# Patient Record
Sex: Male | Born: 1945 | Race: White | Hispanic: No | Marital: Married | Smoking: Former smoker
Health system: Southern US, Community
[De-identification: ages and names within clinical notes are randomized; demographics above are authoritative.]

## PROBLEM LIST (undated history)

## (undated) DIAGNOSIS — E079 Disorder of thyroid, unspecified: Secondary | ICD-10-CM

## (undated) DIAGNOSIS — D649 Anemia, unspecified: Secondary | ICD-10-CM

## (undated) DIAGNOSIS — G309 Alzheimer's disease, unspecified: Secondary | ICD-10-CM

## (undated) DIAGNOSIS — E785 Hyperlipidemia, unspecified: Secondary | ICD-10-CM

## (undated) DIAGNOSIS — E559 Vitamin D deficiency, unspecified: Secondary | ICD-10-CM

## (undated) DIAGNOSIS — M86172 Other acute osteomyelitis, left ankle and foot: Secondary | ICD-10-CM

## (undated) DIAGNOSIS — F0280 Dementia in other diseases classified elsewhere without behavioral disturbance: Secondary | ICD-10-CM

## (undated) HISTORY — DX: Alzheimer's disease, unspecified: G30.9

## (undated) HISTORY — DX: Disorder of thyroid, unspecified: E07.9

## (undated) HISTORY — DX: Hyperlipidemia, unspecified: E78.5

## (undated) HISTORY — DX: Dementia in other diseases classified elsewhere, unspecified severity, without behavioral disturbance, psychotic disturbance, mood disturbance, and anxiety: F02.80

---

## 2003-05-22 ENCOUNTER — Ambulatory Visit (HOSPITAL_COMMUNITY): Admission: RE | Admit: 2003-05-22 | Discharge: 2003-05-22 | Payer: Self-pay | Admitting: *Deleted

## 2011-05-22 ENCOUNTER — Ambulatory Visit: Payer: Self-pay | Admitting: Family Medicine

## 2011-05-22 DIAGNOSIS — E039 Hypothyroidism, unspecified: Secondary | ICD-10-CM

## 2011-05-22 DIAGNOSIS — Z125 Encounter for screening for malignant neoplasm of prostate: Secondary | ICD-10-CM

## 2011-05-22 DIAGNOSIS — E785 Hyperlipidemia, unspecified: Secondary | ICD-10-CM

## 2011-11-15 ENCOUNTER — Other Ambulatory Visit: Payer: Self-pay | Admitting: Physician Assistant

## 2011-11-21 ENCOUNTER — Other Ambulatory Visit: Payer: Self-pay | Admitting: Physician Assistant

## 2012-01-18 ENCOUNTER — Other Ambulatory Visit: Payer: Self-pay | Admitting: Physician Assistant

## 2012-02-23 ENCOUNTER — Other Ambulatory Visit: Payer: Self-pay | Admitting: Physician Assistant

## 2012-02-25 ENCOUNTER — Other Ambulatory Visit: Payer: Self-pay | Admitting: Physician Assistant

## 2012-02-26 ENCOUNTER — Other Ambulatory Visit: Payer: Self-pay | Admitting: Physician Assistant

## 2012-02-26 NOTE — Telephone Encounter (Signed)
Chart pulled to [place] pool pile MR [id number]

## 2012-03-18 ENCOUNTER — Encounter: Payer: Self-pay | Admitting: Family Medicine

## 2012-03-18 ENCOUNTER — Ambulatory Visit (INDEPENDENT_AMBULATORY_CARE_PROVIDER_SITE_OTHER): Payer: Medicare Other | Admitting: Family Medicine

## 2012-03-18 VITALS — BP 102/64 | HR 60 | Temp 98.5°F | Resp 16 | Ht 73.0 in | Wt 235.2 lb

## 2012-03-18 DIAGNOSIS — R413 Other amnesia: Secondary | ICD-10-CM

## 2012-03-18 DIAGNOSIS — E785 Hyperlipidemia, unspecified: Secondary | ICD-10-CM

## 2012-03-18 DIAGNOSIS — E039 Hypothyroidism, unspecified: Secondary | ICD-10-CM

## 2012-03-18 LAB — CBC WITH DIFFERENTIAL/PLATELET
Basophils Absolute: 0 10*3/uL (ref 0.0–0.1)
Basophils Relative: 1 % (ref 0–1)
Eosinophils Absolute: 0.2 10*3/uL (ref 0.0–0.7)
Eosinophils Relative: 4 % (ref 0–5)
HCT: 41.1 % (ref 39.0–52.0)
Hemoglobin: 14 g/dL (ref 13.0–17.0)
Lymphocytes Relative: 34 % (ref 12–46)
Lymphs Abs: 1.5 10*3/uL (ref 0.7–4.0)
MCH: 29.2 pg (ref 26.0–34.0)
MCHC: 34.1 g/dL (ref 30.0–36.0)
MCV: 85.6 fL (ref 78.0–100.0)
Monocytes Absolute: 0.4 10*3/uL (ref 0.1–1.0)
Monocytes Relative: 9 % (ref 3–12)
Neutro Abs: 2.3 10*3/uL (ref 1.7–7.7)
Neutrophils Relative %: 52 % (ref 43–77)
Platelets: 220 10*3/uL (ref 150–400)
RBC: 4.8 MIL/uL (ref 4.22–5.81)
RDW: 13.8 % (ref 11.5–15.5)
WBC: 4.3 10*3/uL (ref 4.0–10.5)

## 2012-03-18 LAB — LIPID PANEL
Cholesterol: 182 mg/dL (ref 0–200)
HDL: 53 mg/dL (ref >39)
LDL Cholesterol: 104 mg/dL — ABNORMAL HIGH (ref 0–99)
Total CHOL/HDL Ratio: 3.4 Ratio
Triglycerides: 125 mg/dL (ref <150)
VLDL: 25 mg/dL (ref 0–40)

## 2012-03-18 LAB — COMPREHENSIVE METABOLIC PANEL
ALT: 16 U/L (ref 0–53)
AST: 17 U/L (ref 0–37)
Albumin: 4.3 g/dL (ref 3.5–5.2)
Alkaline Phosphatase: 59 U/L (ref 39–117)
BUN: 10 mg/dL (ref 6–23)
CO2: 26 mEq/L (ref 19–32)
Calcium: 9.7 mg/dL (ref 8.4–10.5)
Chloride: 106 mEq/L (ref 96–112)
Creat: 0.83 mg/dL (ref 0.50–1.35)
Glucose, Bld: 102 mg/dL — ABNORMAL HIGH (ref 70–99)
Potassium: 4.1 mEq/L (ref 3.5–5.3)
Sodium: 139 mEq/L (ref 135–145)
Total Bilirubin: 0.3 mg/dL (ref 0.3–1.2)
Total Protein: 6.9 g/dL (ref 6.0–8.3)

## 2012-03-18 LAB — TSH: TSH: 3.389 u[IU]/mL (ref 0.350–4.500)

## 2012-03-18 LAB — VITAMIN B12: Vitamin B-12: 364 pg/mL (ref 211–911)

## 2012-03-18 MED ORDER — PRAVASTATIN SODIUM 40 MG PO TABS: 80.0000 mg | ORAL_TABLET | Freq: Every day | ORAL | Status: DC

## 2012-03-18 MED ORDER — LEVOTHYROXINE SODIUM 50 MCG PO TABS: 50.0000 ug | ORAL_TABLET | Freq: Every day | ORAL | Status: DC

## 2012-03-18 NOTE — Progress Notes (Signed)
Bruce Fleming Comes in today for followup on hypothyroidism and hyperlipidemia. He says he feels fine. He has no myalgias or headache or fatigue.  Of concern explains his wife, is that he is having increasing problems with memory. He tends to confuse driving around town, although his never gotten lost. He forgets where he puts things at home. He has a strong family history of Alzheimer's to his mother and grandmother.  Refuses dT and flu shot  Patient now retired.  Objective:  NAD.  Appears healthy and happy.   Depression questionnaire completed:  Score is 0 Eyes:  Normal fundi and external inspection Neck:  No thyromegaly Chest:  Clear Heart:  Regular, no murmur Skin: clear Abdomen:  Soft, nontender, no HSM Ext:  No edema  Mini mental status exam score 17:  Mild cognitive impairment.  Assessment:  Stable physically, but of great concern is the memory issues.  He does not want a neurology referral.  We discussed the possibility of Aricept, which he and wife declined at this point.    Plan: 1. Memory loss  Vitamin B12, TSH, Comprehensive metabolic panel, Lipid panel, CBC with Differential  2. Hypothyroid  TSH, levothyroxine (SYNTHROID, LEVOTHROID) 50 MCG tablet  3. Hyperlipidemia  Lipid panel, pravastatin (PRAVACHOL) 40 MG tablet   I reinforced the importance of taking the thyroid medication on an empty stomach

## 2012-08-19 ENCOUNTER — Telehealth: Payer: Self-pay

## 2012-08-19 NOTE — Telephone Encounter (Signed)
I have spoken to wife, she is advised letter will need to come from Dr Milus Glazier, jury duty summons not due until April 9th, to you . For review.

## 2012-08-19 NOTE — Telephone Encounter (Signed)
Pt's wife brought in a [employer] for pt and requests that we follow the directions on the form in order for pt to be excused d/t his dementia. I have put this in [place] McClung's box since she is covering for Dr L in his absence.

## 2012-08-19 NOTE — Telephone Encounter (Signed)
LMOM to CB. 

## 2012-08-19 NOTE — Telephone Encounter (Signed)
Call patient and let them know they need to bring in the note they intend to send that states what his disability is and an explanation of why it prevents him from serving(per the jury statement they brought [place]) that will go along with the doctor's note if Dr. Elbert Ewings approves it.  I do not see enough in the chart to document what the letter is asking for in the chart.  Dr. Elbert Ewings has only seen the patient one time, and I am unable to provide enough info.  This will have to wait until Dr. Elbert Ewings returns and I will put it in his box if they bring the other note.

## 2012-08-30 ENCOUNTER — Encounter: Payer: Self-pay | Admitting: Family Medicine

## 2012-09-07 NOTE — Telephone Encounter (Signed)
Letter has been written. 

## 2013-03-13 ENCOUNTER — Other Ambulatory Visit: Payer: Self-pay | Admitting: Family Medicine

## 2013-04-07 ENCOUNTER — Encounter: Payer: Self-pay | Admitting: Family Medicine

## 2013-04-07 ENCOUNTER — Ambulatory Visit (INDEPENDENT_AMBULATORY_CARE_PROVIDER_SITE_OTHER): Payer: Medicare Other | Admitting: Family Medicine

## 2013-04-07 VITALS — BP 110/74 | HR 71 | Temp 97.7°F | Resp 16 | Ht 73.5 in | Wt 239.0 lb

## 2013-04-07 DIAGNOSIS — E039 Hypothyroidism, unspecified: Secondary | ICD-10-CM

## 2013-04-07 DIAGNOSIS — R413 Other amnesia: Secondary | ICD-10-CM

## 2013-04-07 DIAGNOSIS — E785 Hyperlipidemia, unspecified: Secondary | ICD-10-CM

## 2013-04-07 LAB — COMPREHENSIVE METABOLIC PANEL
ALT: 15 U/L (ref 0–53)
AST: 16 U/L (ref 0–37)
Albumin: 4.3 g/dL (ref 3.5–5.2)
Alkaline Phosphatase: 58 U/L (ref 39–117)
BUN: 11 mg/dL (ref 6–23)
CO2: 27 mEq/L (ref 19–32)
Calcium: 9.6 mg/dL (ref 8.4–10.5)
Chloride: 105 mEq/L (ref 96–112)
Creat: 0.89 mg/dL (ref 0.50–1.35)
Glucose, Bld: 115 mg/dL — ABNORMAL HIGH (ref 70–99)
Potassium: 4.1 mEq/L (ref 3.5–5.3)
Sodium: 139 mEq/L (ref 135–145)
Total Bilirubin: 0.4 mg/dL (ref 0.3–1.2)
Total Protein: 6.9 g/dL (ref 6.0–8.3)

## 2013-04-07 LAB — TSH: TSH: 2.834 u[IU]/mL (ref 0.350–4.500)

## 2013-04-07 LAB — LIPID PANEL
Cholesterol: 234 mg/dL — ABNORMAL HIGH (ref 0–200)
HDL: 57 mg/dL (ref >39)
LDL Cholesterol: 157 mg/dL — ABNORMAL HIGH (ref 0–99)
Total CHOL/HDL Ratio: 4.1 Ratio
Triglycerides: 100 mg/dL (ref <150)
VLDL: 20 mg/dL (ref 0–40)

## 2013-04-07 MED ORDER — DONEPEZIL HCL 5 MG PO TABS: 5.0000 mg | ORAL_TABLET | Freq: Every evening | ORAL | Status: DC | PRN

## 2013-04-07 MED ORDER — LEVOTHYROXINE SODIUM 50 MCG PO TABS: 50.0000 ug | ORAL_TABLET | Freq: Every day | ORAL | Status: DC

## 2013-04-07 MED ORDER — PRAVASTATIN SODIUM 40 MG PO TABS: 80.0000 mg | ORAL_TABLET | Freq: Every day | ORAL | Status: DC
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
## 2013-04-07 NOTE — Patient Instructions (Signed)
Start coated baby aspirin 81 mg daily   Immunization Schedule, Adult  Influenza vaccine.  Adults should be given 1 dose every year.  Tetanus, diphtheria, and pertussis (Td, Tdap) vaccine.  Adults who have not previously been given Tdap or who do not know their vaccine status should be given 1 dose of Tdap.  Adults should have a Td booster every 10 years.  Doses should be given if needed to catch up on missed doses in the past.  Pregnant women should be given 1 dose of Tdap vaccine during each pregnancy.  Varicella vaccine.  All adults without evidence of immunity to varicella should receive 2 doses or a second dose if they have received only 1 dose.  Pregnant women who do not have evidence of immunity should be given the first dose after their pregnancy.  Human papillomavirus (HPV) vaccine.  Women aged 15 through 26 years who have not been given the vaccine previously should be given the 3 dose series. The second dose should be given 1 to 2 months after the first dose. The third dose should be given at least 24 weeks after the first dose.  The vaccine is not recommended for use in pregnant women. However, pregnancy testing is not needed before being given a dose. If a woman is found to be pregnant after being given a dose, no treatment is needed. In that case, the remaining doses should be delayed until after the pregnancy.  Men aged 43 through 21 years who have not been given the vaccine previously should be given the 3 dose series. Men aged 52 through 21 years may be given the 3 dose series. The second dose should be given 1 to 2 months after the first dose. The third dose should be given at least 24 weeks after the first dose.  Zoster vaccine.  One dose is recommended for adults aged 61 years and older unless certain conditions are present.  Measles, mumps, and rubella (MMR) vaccine.  Adults born before 29 generally are considered immune to measles and mumps. Healthcare  workers born before 1957 who do not have evidence of immunity should consider vaccination.  Adults born in [birth year] or later should have 1 or more doses of MMR vaccine unless there is a contraindication for the vaccine or they have evidence of immunity to the diseases. A second dose should be given at least 28 days after the first dose. Adults receiving certain types of previous vaccines should consider or be given vaccine doses.  For women of childbearing age, rubella immunity should be determined. If there is no evidence of immunity, women who are not pregnant should be vaccinated. If there is no evidence of immunity, women who are pregnant should delay vaccination until after their pregnancy.  Pneumococcal polysaccharide (PPSV23) vaccine.  All adults aged 30 years and older should be given 1 dose.  Adults younger than age 72 years who have certain medical conditions, who smoke cigarettes, who reside in nursing homes or long-term care facilities, or who have an unknown vaccination history should usually be given 1 or 2 doses of the vaccine.  Pneumococcal 13-valent conjugate (PVC13) vaccine.  Adults aged 93 years or older with certain medical conditions and an unknown or incomplete pneumococcal vaccination history should usually be given 1 dose of the vaccine. This dose may be in addition to a PPSV23 vaccine dose.  Meningococcal vaccine.  First-year college students up to age 58 years who are living in residence halls should be given a  dose if they did not receive a dose on or after their 16th birthday.  A dose should be given to microbiologists working with certain meningitis bacteria, military recruits, and people who travel to or live in countries with a high rate of meningitis.  One or 2 doses should be given to adults who have certain high-risk conditions.  Hepatitis A vaccine.  Adults who wish to be protected from this disease, who have certain high-risk conditions, who work with  hepatitis A-infected animals, who work in hepatitis A research labs, or who travel to or work in countries with a high rate of hepatitis A should be given the 2 dose series of the vaccine.  Adults who were previously unvaccinated and who anticipate close contact with an international adoptee during the first 60 days after arrival in the [place] from a country with a high rate of hepatitis A should be given the vaccine. The first dose of the 2 dose series should be given 2 or more weeks before the arrival of the adoptee.  Hepatitis B vaccine.  Adults who wish to be protected from this disease, who have certain high-risk conditions, who may be exposed to blood or other infectious body fluids, who are household contacts or sex partners of hepatitis B positive people, who are clients or workers in certain care facilities, or who travel to or work in countries with a high rate of hepatitis B should be given the 3 dose series of the vaccine. If you travel outside the [place], additional vaccines may be needed. The Centers for Disease Control and Prevention (CDC) provides information about the vaccines, medicines, and other measures necessary to prevent illness and injury during international travel. Visit the CDC website at http://www.church.org/ or call (800) CDC-INFO [(442)355-0501]. You may also consult a travel clinic or your caregiver. Document Released: 08/23/2003 Document Revised: 08/25/2011 Document Reviewed: 07/18/2011 [facility] Patient Information 2014 [place], [place].

## 2013-05-19 ENCOUNTER — Ambulatory Visit: Payer: Medicare Other | Admitting: Family Medicine

## 2014-02-03 ENCOUNTER — Other Ambulatory Visit: Payer: Self-pay | Admitting: Geriatric Medicine

## 2014-02-03 DIAGNOSIS — F0280 Dementia in other diseases classified elsewhere without behavioral disturbance: Secondary | ICD-10-CM

## 2014-02-03 DIAGNOSIS — G309 Alzheimer's disease, unspecified: Principal | ICD-10-CM

## 2014-02-13 ENCOUNTER — Ambulatory Visit
Admission: RE | Admit: 2014-02-13 | Discharge: 2014-02-13 | Disposition: A | Discharge status: Home / Self Care | Payer: Medicare Other | Source: Ambulatory Visit | Attending: Geriatric Medicine | Admitting: Geriatric Medicine

## 2014-02-13 DIAGNOSIS — F0280 Dementia in other diseases classified elsewhere without behavioral disturbance: Secondary | ICD-10-CM

## 2014-02-13 DIAGNOSIS — G309 Alzheimer's disease, unspecified: Principal | ICD-10-CM

## 2014-02-13 MED ORDER — GADOBENATE DIMEGLUMINE 529 MG/ML IV SOLN
20.0000 mL | Freq: Once | INTRAVENOUS | Status: AC | PRN
  Administered 2014-02-13: 20 mL via INTRAVENOUS

## 2014-10-14 ENCOUNTER — Other Ambulatory Visit: Payer: Self-pay | Admitting: Family Medicine

## 2014-11-12 ENCOUNTER — Other Ambulatory Visit: Payer: Self-pay | Admitting: Family Medicine

## 2014-11-19 ENCOUNTER — Other Ambulatory Visit: Payer: Self-pay | Admitting: Family Medicine

## 2014-11-19 NOTE — Telephone Encounter (Signed)
Pt is requesting a refill of pravastatin. He hasn't been seen since 2014, however he has scheduled an appt at 104 on 6/27. He wants to know if we can do a "courtesy refill" to hold him until then.

## 2014-11-20 NOTE — Telephone Encounter (Signed)
Please review pt req.

## 2014-12-11 ENCOUNTER — Ambulatory Visit (INDEPENDENT_AMBULATORY_CARE_PROVIDER_SITE_OTHER): Payer: Medicare Other | Admitting: Physician Assistant

## 2014-12-11 ENCOUNTER — Encounter: Payer: Self-pay | Admitting: Physician Assistant

## 2014-12-11 VITALS — BP 123/77 | HR 63 | Temp 98.1°F | Resp 16 | Ht 73.5 in | Wt 234.0 lb

## 2014-12-11 DIAGNOSIS — G309 Alzheimer's disease, unspecified: Secondary | ICD-10-CM

## 2014-12-11 DIAGNOSIS — E039 Hypothyroidism, unspecified: Secondary | ICD-10-CM | POA: Diagnosis not present

## 2014-12-11 DIAGNOSIS — E785 Hyperlipidemia, unspecified: Secondary | ICD-10-CM

## 2014-12-11 DIAGNOSIS — F0280 Dementia in other diseases classified elsewhere without behavioral disturbance: Secondary | ICD-10-CM

## 2014-12-11 LAB — TSH: TSH: 2.412 u[IU]/mL (ref 0.350–4.500)

## 2014-12-11 LAB — COMPREHENSIVE METABOLIC PANEL
ALBUMIN: 4.2 g/dL (ref 3.5–5.2)
ALT: 15 U/L (ref 0–53)
AST: 19 U/L (ref 0–37)
Alkaline Phosphatase: 55 U/L (ref 39–117)
BUN: 13 mg/dL (ref 6–23)
CHLORIDE: 105 meq/L (ref 96–112)
CO2: 27 mEq/L (ref 19–32)
CREATININE: 0.77 mg/dL (ref 0.50–1.35)
Calcium: 9.6 mg/dL (ref 8.4–10.5)
Glucose, Bld: 77 mg/dL (ref 70–99)
Potassium: 4.2 mEq/L (ref 3.5–5.3)
Sodium: 141 mEq/L (ref 135–145)
Total Bilirubin: 0.5 mg/dL (ref 0.2–1.2)
Total Protein: 6.9 g/dL (ref 6.0–8.3)

## 2014-12-11 LAB — LIPID PANEL
CHOL/HDL RATIO: 3.4 ratio
CHOLESTEROL: 209 mg/dL — AB (ref 0–200)
HDL: 61 mg/dL (ref >=40)
LDL CALC: 111 mg/dL — AB (ref 0–99)
TRIGLYCERIDES: 186 mg/dL — AB (ref <150)
VLDL: 37 mg/dL (ref 0–40)

## 2014-12-11 MED ORDER — PRAVASTATIN SODIUM 40 MG PO TABS: 80.0000 mg | ORAL_TABLET | Freq: Every day | ORAL | Status: DC

## 2014-12-11 MED ORDER — LEVOTHYROXINE SODIUM 50 MCG PO TABS: 50.0000 ug | ORAL_TABLET | Freq: Every day | ORAL | Status: DC

## 2014-12-11 NOTE — Progress Notes (Signed)
   Subjective:    Patient ID: Bruce Fleming, male    DOB: 1946/03/30, 69 y.o.   MRN: [id number]  HPI Patient presents with wife his caretaker for refill of levothyroxine and pravastatin. Wife states that he is just about out of both medications. Has been compliant as she is the one who administers them. Patient states that he is not following in particular diet and does not exercise. Denies SOB, CP, edema, HA, dizziness, N/V, fatigue, skin/hair changes, or constipation.   Wife states that patient was dx with Alzheimer's dementia 1 1/2 years ago and states that he has not had f/u with Dr. Pete Fleming since that appointment as she does not think it will make a difference. States that patient is roaming at night and is becoming more aggressive towards her over the past few months, but they are maintaining just fine. Is no longer taking Donepezil as they did not notice a difference.  Review of Systems  Constitutional: Negative for fever, activity change and fatigue.  Eyes: Negative for visual disturbance.  Respiratory: Negative for shortness of breath.   Cardiovascular: Negative for chest pain, palpitations and leg swelling.  Gastrointestinal: Negative for nausea, vomiting and constipation.  Endocrine: Negative.   Neurological: Negative for dizziness and headaches.  Psychiatric/Behavioral: Positive for behavioral problems, confusion and sleep disturbance. Negative for dysphoric mood.      Objective:   Physical Exam  Constitutional: He is oriented to person, place, and time. He appears well-developed and well-nourished. No distress.  Blood pressure 123/77, pulse 63, temperature 98.1 F (36.7 C), temperature source Oral, resp. rate 16, height 6' 1.5" (1.867 m), weight 234 lb (106.142 kg), SpO2 97 %.   HENT:  Head: Normocephalic and atraumatic.  Right Ear: External ear normal.  Left Ear: External ear normal.  Eyes: Conjunctivae are normal. Pupils are equal, round, and reactive to light.  Right eye exhibits no discharge. Left eye exhibits no discharge. No scleral icterus.  Neck: Normal range of motion. Neck supple. No JVD present. Carotid bruit is not present.  Cardiovascular: Normal rate, regular rhythm and normal heart sounds.  Exam reveals no gallop and no friction rub.   No murmur heard. Pulmonary/Chest: Effort normal and breath sounds normal. No respiratory distress. He has no wheezes. He has no rales.  Abdominal: Soft. Bowel sounds are normal.  Lymphadenopathy:    He has no cervical adenopathy.  Neurological: He is alert and oriented to person, place, and time.  Skin: He is not diaphoretic.  Psychiatric: He has a normal mood and affect. His behavior is normal. Judgment and thought content normal.       Assessment & Plan:  1. Hypothyroidism, unspecified hypothyroidism type - levothyroxine (SYNTHROID, LEVOTHROID) 50 MCG tablet; Take 1 tablet (50 mcg total) by mouth daily before breakfast. Needs office visit/labs  Dispense: 30 tablet; Refill: 11 - TSH  2. Hyperlipidemia Lifestyle modifications discussed. - pravastatin (PRAVACHOL) 40 MG tablet; Take 2 tablets (80 mg total) by mouth daily.  Dispense: 60 tablet; Refill: 0 - Lipid panel - Comprehensive metabolic panel  3. Alzheimer's disease Encouraged to f/u with Dr. Pete Fleming his geriatrician for better maintenance and tips for caring for dz as it progresses.   Janan Ridge PA-C  [facility] 12/11/2014 4:34 PM

## 2014-12-12 ENCOUNTER — Encounter: Payer: Self-pay | Admitting: Physician Assistant

## 2014-12-16 ENCOUNTER — Other Ambulatory Visit: Payer: Self-pay | Admitting: Physician Assistant

## 2015-02-09 ENCOUNTER — Encounter (HOSPITAL_COMMUNITY): Payer: Self-pay | Admitting: Emergency Medicine

## 2015-02-09 ENCOUNTER — Emergency Department (HOSPITAL_COMMUNITY)
Admission: EM | Admit: 2015-02-09 | Discharge: 2015-02-09 | Disposition: A | Discharge status: Home / Self Care | Payer: Medicare Other | Attending: Emergency Medicine | Admitting: Emergency Medicine

## 2015-02-09 DIAGNOSIS — Z87891 Personal history of nicotine dependence: Secondary | ICD-10-CM | POA: Insufficient documentation

## 2015-02-09 DIAGNOSIS — E785 Hyperlipidemia, unspecified: Secondary | ICD-10-CM | POA: Insufficient documentation

## 2015-02-09 DIAGNOSIS — R000 Tachycardia, unspecified: Secondary | ICD-10-CM | POA: Insufficient documentation

## 2015-02-09 DIAGNOSIS — F0280 Dementia in other diseases classified elsewhere without behavioral disturbance: Secondary | ICD-10-CM | POA: Diagnosis not present

## 2015-02-09 DIAGNOSIS — G309 Alzheimer's disease, unspecified: Secondary | ICD-10-CM | POA: Diagnosis not present

## 2015-02-09 DIAGNOSIS — Z79899 Other long term (current) drug therapy: Secondary | ICD-10-CM | POA: Insufficient documentation

## 2015-02-09 DIAGNOSIS — E079 Disorder of thyroid, unspecified: Secondary | ICD-10-CM | POA: Diagnosis not present

## 2015-02-09 DIAGNOSIS — F0390 Unspecified dementia without behavioral disturbance: Secondary | ICD-10-CM

## 2015-02-09 LAB — URINALYSIS, ROUTINE W REFLEX MICROSCOPIC
Bilirubin Urine: NEGATIVE
GLUCOSE, UA: NEGATIVE mg/dL
Hgb urine dipstick: NEGATIVE
KETONES UR: NEGATIVE mg/dL
Leukocytes, UA: NEGATIVE
Nitrite: NEGATIVE
Protein, ur: NEGATIVE mg/dL
Specific Gravity, Urine: 1.027 (ref 1.005–1.030)
UROBILINOGEN UA: 0.2 mg/dL (ref 0.0–1.0)
pH: 6 (ref 5.0–8.0)

## 2015-02-09 LAB — CBC
HCT: 39.4 % (ref 39.0–52.0)
HEMOGLOBIN: 12.8 g/dL — AB (ref 13.0–17.0)
MCH: 28.4 pg (ref 26.0–34.0)
MCHC: 32.5 g/dL (ref 30.0–36.0)
MCV: 87.6 fL (ref 78.0–100.0)
PLATELETS: 188 10*3/uL (ref 150–400)
RBC: 4.5 MIL/uL (ref 4.22–5.81)
RDW: 13 % (ref 11.5–15.5)
WBC: 4.6 10*3/uL (ref 4.0–10.5)

## 2015-02-09 LAB — COMPREHENSIVE METABOLIC PANEL
ALK PHOS: 54 U/L (ref 38–126)
ALT: 18 U/L (ref 17–63)
ANION GAP: 5 (ref 5–15)
AST: 26 U/L (ref 15–41)
Albumin: 3.9 g/dL (ref 3.5–5.0)
BILIRUBIN TOTAL: 0.6 mg/dL (ref 0.3–1.2)
BUN: 20 mg/dL (ref 6–20)
CALCIUM: 9.1 mg/dL (ref 8.9–10.3)
CO2: 27 mmol/L (ref 22–32)
CREATININE: 0.86 mg/dL (ref 0.61–1.24)
Chloride: 106 mmol/L (ref 101–111)
Glucose, Bld: 98 mg/dL (ref 65–99)
Potassium: 4.1 mmol/L (ref 3.5–5.1)
Sodium: 138 mmol/L (ref 135–145)
TOTAL PROTEIN: 6.9 g/dL (ref 6.5–8.1)

## 2015-02-09 MED ORDER — TUBERCULIN PPD 5 UNIT/0.1ML ID SOLN
5.0000 [IU] | Freq: Once | INTRADERMAL | Status: DC
  Administered 2015-02-09: 5 [IU] via INTRADERMAL
  Filled 2015-02-09 (×2): qty 0.1
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
## 2015-02-09 NOTE — ED Notes (Signed)
Bed: WA08 Expected date:  Expected time:  Means of arrival:  Comments: HOLD for triage 3
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
## 2015-02-09 NOTE — Progress Notes (Signed)
CSW received call from [facility]. Patient recently had increased confusion and altered mental status, and even had issued a silver alert for patient. Patient family concerned about drastic confusion. Pt family reports possibly early stages of dementia. Per [facility], patient can be assessed by ALF nurse in the ED, and possibly admitted to ALF today. CSW to complete fl2 once MD completes medical evaluation. Pt will need tb skin test placed with instructions to be read at facility.   Olga Coaster, LCSW  Clinical Social Work  [employer] [phone]

## 2015-02-09 NOTE — ED Notes (Signed)
Delay on lab draw until further notice from triage nurse

## 2015-02-09 NOTE — ED Notes (Signed)
Pt left the house on Wed. And was missing (Silver alert out for pt) until this morning.  Pt has dementia and is currently needing testing to get into [facility].  Belenda Cruise with social work has been made aware and is currently working with [employer] to get the steps done to get pt there.

## 2015-02-23 ENCOUNTER — Telehealth: Payer: Self-pay

## 2015-02-23 NOTE — Telephone Encounter (Signed)
Dr. Milus Glazier, patient's wife Bonita Quin states patient is now at [facility] in the memory care unit. She would like for you to fax over a letter stating he no longer needs his thyroid and cholesterol medications (levothyroxine and pravastatin). She says "it's crazy to take meds if he's slowly dying." Per patient's wife, he was on a silver alert 2 weeks ago. She says her and her husband have talked about this already and they don't want to do anything to prolong living like this. She says the cost of [facility] is $6000 per month. Can a letter be faxed? If so, send to attn Algis Liming at fax number [phone] (fax number provided by Bonita Quin). Call if any questions at [phone] (home #).

## 2015-02-23 NOTE — Telephone Encounter (Signed)
Dr L, please read other issue below. [facility] is requesting a Rx for Ativan 0.5 mg, 1 tab BID prn anxiety/agitation. This should be faxed to pharm at [phone] or facility [phone].

## 2015-02-24 ENCOUNTER — Other Ambulatory Visit: Payer: Self-pay | Admitting: Family Medicine

## 2015-02-24 MED ORDER — LORAZEPAM 0.5 MG PO TABS: 0.5000 mg | ORAL_TABLET | Freq: Two times a day (BID) | ORAL | Status: DC | PRN

## 2015-02-26 NOTE — Telephone Encounter (Signed)
Dr. Milus Glazier, if you can review the last message from [facility] regarding a letter that the wife is requesting for pt Bruce Fleming. If you have already written a letter please send message to our clinical pool.

## 2015-02-27 ENCOUNTER — Encounter (HOSPITAL_COMMUNITY): Payer: Self-pay | Admitting: Family Medicine

## 2015-02-27 NOTE — Telephone Encounter (Signed)
Faxed Rx to pharm at number below. Dr L, please address the letter below.

## 2015-03-06 NOTE — Telephone Encounter (Signed)
Faxed letter written by Dr L as pt's wife req'd to Algis Liming at [facility] w/confirmation.

## 2015-03-14 ENCOUNTER — Telehealth: Payer: Self-pay

## 2015-03-14 NOTE — Telephone Encounter (Signed)
Bonita Quin is calling because she saw that Dr. Patria Mane documented that the patient has 3 months left to live. She wants to know if this is correct or not. Patient normally sees Dr. Milus Glazier. Please call! [phone]

## 2015-03-15 ENCOUNTER — Telehealth: Payer: Self-pay | Admitting: Family Medicine

## 2015-03-15 NOTE — Telephone Encounter (Signed)
Please inform Bonita Quin that in order to have Hospice come, we have to indicate a three month survival expectation.  Otherwise, they won't come

## 2015-03-15 NOTE — Telephone Encounter (Signed)
[facility] states that they faxed two forms over in reference to patient's lab results. They faxed this on 03/12/2015 and were told that it would be faxed back the same day. Can they have a status update?  [phone] (phone)  [phone] (fax)

## 2015-03-15 NOTE — Telephone Encounter (Signed)
Spoke with Bonita Quin about Ravinder, I gave her your message, she stated she understood but wanted to verify with you if you saw any indication that he wouldn't make it to three months or more, from previous lab work and past history. Also she wanted to know if you had spoken to Dr. Patria Mane about Iantha Fallen health recently.

## 2015-03-15 NOTE — Telephone Encounter (Signed)
Patient's wife Bruce Fleming left voicemail requesting medical records. Did not say specifically which records. Please call back at [phone].

## 2015-03-15 NOTE — Telephone Encounter (Signed)
Let message for Bonita Quin to call back.

## 2015-03-16 ENCOUNTER — Telehealth: Payer: Self-pay

## 2015-03-16 NOTE — Telephone Encounter (Signed)
[facility] called today because Vivan wife called them to set up hospice and from what she had told them they did not feel it was necessary for him to have hospice. They wanted to get a medical opinion from you if you feel like he needs it. So they gave a number and fax number. The person who called was Drenda Freeze. The office number is [phone]. Fax # [phone]

## 2015-03-16 NOTE — Telephone Encounter (Signed)
Linda left voicemail on Thursday at 3:19pm asking for a call back regarding records. Please call.

## 2015-03-16 NOTE — Telephone Encounter (Signed)
Have you seen these forms? 

## 2015-03-16 NOTE — Telephone Encounter (Signed)
I've looked in all the scan boxes and Dr. Loma Boston box and did not see any forms.

## 2015-03-16 NOTE — Telephone Encounter (Signed)
I have not seen these forms but I will look for them.

## 2015-03-18 ENCOUNTER — Telehealth: Payer: Self-pay
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
## 2015-03-19 NOTE — Telephone Encounter (Signed)
Spoke with nurse she just needs his orders signed. His fingers are not swollen today but they really need these orders. Dr Milus Glazier do you recall?

## 2015-03-19 NOTE — Telephone Encounter (Signed)
Pt should RTC.

## 2015-03-19 NOTE — Telephone Encounter (Signed)
Please call patient and ask what is going on. KL

## 2015-03-20 NOTE — Telephone Encounter (Signed)
FL-2 form received and placed in Dr. Loma Boston box for review and to sign. Cover page has instructions to fax to Tammy at [phone].

## 2015-03-20 NOTE — Telephone Encounter (Signed)
Spoke with Bruce Fleming. She states [place] at [facility] has faxed an FL-2 form for Dr. Milus Glazier to complete. Once the form is completed, she needs it faxed to attn Tammy at [facility]. Bruce Fleming states [facility] have taken her for $10,000 within a month and is moving her husband to the new facility. Will look for forms. If unable to locate form, will call [facility] and have them refax it.

## 2015-03-21 ENCOUNTER — Telehealth: Payer: Self-pay

## 2015-03-21 NOTE — Telephone Encounter (Signed)
DR L - Pt's wife came by and she is having him moved to a different location.  She needs forms filled out by you and faxed over to [facility].  Medical records are getting together the records needed.  Linda's number is [phone] if you need to call her.   I have left the folder she brought by in your box.

## 2015-03-27 NOTE — Telephone Encounter (Signed)
Patient's wife Bonita Quin signed a request on 03/21/15 to have records sent to [facility]. She was not sure what they needed and asked [place] to call. Called today and was transferred to the Memory Care Unit (where the patient will be going) and spoke with Joni Reining. I asked what was needed. She proceeded to give a list of what they needed. When I stated that the FL-2 form was faxed last week with confirmation to attn Tammy (who I was told to fax it to) she got really rude and stated "I don't care who you were told to fax it to, I don't have it and you need to fax it again." I told her she didn't have to be rude and she got louder and said "I'm not being rude, I'm telling you what I need you to send." I told her I would send last 5 office notes and she can go from there based of those ov notes. I asked for fax number and refused to give it to me until I gave her my name (which I did when she first got on the phone). I gave her my first name only then she gave me the fax# [phone]. Records faxed to her attention.

## 2015-03-29 ENCOUNTER — Telehealth: Payer: Self-pay | Admitting: Radiology

## 2015-03-29 NOTE — Telephone Encounter (Signed)
There is a FL2 to be filled out but Dr L can not complete this without evaluating the patient, he has not seen patient in over 2 years. I left message for her to advise, she may call back

## 2015-04-12 ENCOUNTER — Encounter: Payer: Self-pay | Admitting: Family Medicine

## 2015-04-12 ENCOUNTER — Telehealth: Payer: Self-pay | Admitting: Family Medicine

## 2015-04-12 NOTE — Telephone Encounter (Signed)
LEFT A MESSAGE FOR PATIENT TO RETURN CALL TO SCHEDULE APPOINTMENT TO CLOSE GAPS IN CARE.  HE NEEDS COLONOSCOPY, PREVNAR VACCINE, BMI SCREENING AND FOLLOW UP PLAN, AND SCREENING FOR HIGH BLOOD PRESSURE AND FOLLOW UP DOCUMENTATION.

## 2015-04-16 NOTE — Telephone Encounter (Signed)
Wife states that he is settled into an assisted living place and no longer needs form filled out.

## 2015-04-16 NOTE — Telephone Encounter (Signed)
Wife states that he is settled into an assisting living place and that pt will not be in to close these gaps in care.  Just FYI.

## 2015-04-23 ENCOUNTER — Encounter: Payer: Self-pay | Admitting: Family Medicine

## 2016-01-14 ENCOUNTER — Encounter (HOSPITAL_COMMUNITY): Payer: Self-pay | Admitting: Emergency Medicine

## 2016-01-14 ENCOUNTER — Emergency Department (HOSPITAL_COMMUNITY)
Admission: EM | Admit: 2016-01-14 | Discharge: 2016-01-14 | Disposition: A | Discharge status: Home / Self Care | Payer: Medicare Other | Attending: Emergency Medicine | Admitting: Emergency Medicine

## 2016-01-14 DIAGNOSIS — Z87891 Personal history of nicotine dependence: Secondary | ICD-10-CM | POA: Diagnosis not present

## 2016-01-14 DIAGNOSIS — F918 Other conduct disorders: Secondary | ICD-10-CM | POA: Diagnosis present

## 2016-01-14 DIAGNOSIS — Z0389 Encounter for observation for other suspected diseases and conditions ruled out: Secondary | ICD-10-CM | POA: Insufficient documentation

## 2016-01-14 DIAGNOSIS — R4689 Other symptoms and signs involving appearance and behavior: Secondary | ICD-10-CM

## 2016-01-14 DIAGNOSIS — G309 Alzheimer's disease, unspecified: Secondary | ICD-10-CM | POA: Insufficient documentation

## 2016-01-14 DIAGNOSIS — Z79899 Other long term (current) drug therapy: Secondary | ICD-10-CM | POA: Insufficient documentation

## 2016-01-14 LAB — URINALYSIS, ROUTINE W REFLEX MICROSCOPIC
Bilirubin Urine: NEGATIVE
GLUCOSE, UA: NEGATIVE mg/dL
Hgb urine dipstick: NEGATIVE
Ketones, ur: NEGATIVE mg/dL
LEUKOCYTES UA: NEGATIVE
NITRITE: NEGATIVE
PH: 6.5 (ref 5.0–8.0)
Protein, ur: NEGATIVE mg/dL
SPECIFIC GRAVITY, URINE: 1.024 (ref 1.005–1.030)

## 2016-01-14 LAB — CBC WITH DIFFERENTIAL/PLATELET
BASOS PCT: 0 %
Basophils Absolute: 0 10*3/uL (ref 0.0–0.1)
Eosinophils Absolute: 0.1 10*3/uL (ref 0.0–0.7)
Eosinophils Relative: 1 %
HEMATOCRIT: 34.9 % — AB (ref 39.0–52.0)
HEMOGLOBIN: 11.9 g/dL — AB (ref 13.0–17.0)
LYMPHS ABS: 1.2 10*3/uL (ref 0.7–4.0)
LYMPHS PCT: 22 %
MCH: 29.8 pg (ref 26.0–34.0)
MCHC: 34.1 g/dL (ref 30.0–36.0)
MCV: 87.5 fL (ref 78.0–100.0)
MONOS PCT: 14 %
Monocytes Absolute: 0.7 10*3/uL (ref 0.1–1.0)
NEUTROS ABS: 3.3 10*3/uL (ref 1.7–7.7)
NEUTROS PCT: 63 %
Platelets: 157 10*3/uL (ref 150–400)
RBC: 3.99 MIL/uL — ABNORMAL LOW (ref 4.22–5.81)
RDW: 12.9 % (ref 11.5–15.5)
WBC: 5.2 10*3/uL (ref 4.0–10.5)

## 2016-01-14 LAB — BASIC METABOLIC PANEL
Anion gap: 4 — ABNORMAL LOW (ref 5–15)
BUN: 21 mg/dL — ABNORMAL HIGH (ref 6–20)
CHLORIDE: 107 mmol/L (ref 101–111)
CO2: 28 mmol/L (ref 22–32)
CREATININE: 0.61 mg/dL (ref 0.61–1.24)
Calcium: 8.9 mg/dL (ref 8.9–10.3)
GFR calc non Af Amer: >60 mL/min (ref >60)
GLUCOSE: 92 mg/dL (ref 65–99)
Potassium: 3.8 mmol/L (ref 3.5–5.1)
Sodium: 139 mmol/L (ref 135–145)

## 2016-01-14 LAB — VALPROIC ACID LEVEL: Valproic Acid Lvl: 13 ug/mL — ABNORMAL LOW (ref 50.0–100.0)
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
## 2016-01-14 NOTE — Discharge Instructions (Signed)
Depakote level was a little low today.  This will need to be addressed with your primary care doctor so please follow-up with them. Return here for any new/worsening symptoms or concerns.
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
## 2016-02-01 ENCOUNTER — Emergency Department (HOSPITAL_COMMUNITY)
Admission: EM | Admit: 2016-02-01 | Discharge: 2016-02-01 | Disposition: A | Discharge status: Home / Self Care | Payer: Medicare Other | Attending: Emergency Medicine | Admitting: Emergency Medicine

## 2016-02-01 DIAGNOSIS — F0391 Unspecified dementia with behavioral disturbance: Secondary | ICD-10-CM | POA: Insufficient documentation

## 2016-02-01 DIAGNOSIS — R451 Restlessness and agitation: Secondary | ICD-10-CM

## 2016-02-01 DIAGNOSIS — Z79899 Other long term (current) drug therapy: Secondary | ICD-10-CM | POA: Diagnosis not present

## 2016-02-01 DIAGNOSIS — Z87891 Personal history of nicotine dependence: Secondary | ICD-10-CM | POA: Diagnosis not present

## 2016-02-01 DIAGNOSIS — E039 Hypothyroidism, unspecified: Secondary | ICD-10-CM | POA: Insufficient documentation

## 2016-02-01 LAB — CBC
HEMATOCRIT: 34.5 % — AB (ref 39.0–52.0)
Hemoglobin: 11.7 g/dL — ABNORMAL LOW (ref 13.0–17.0)
MCH: 29.6 pg (ref 26.0–34.0)
MCHC: 33.9 g/dL (ref 30.0–36.0)
MCV: 87.3 fL (ref 78.0–100.0)
PLATELETS: 177 10*3/uL (ref 150–400)
RBC: 3.95 MIL/uL — ABNORMAL LOW (ref 4.22–5.81)
RDW: 13.1 % (ref 11.5–15.5)
WBC: 5.6 10*3/uL (ref 4.0–10.5)

## 2016-02-01 LAB — URINALYSIS, ROUTINE W REFLEX MICROSCOPIC
BILIRUBIN URINE: NEGATIVE
GLUCOSE, UA: NEGATIVE mg/dL
HGB URINE DIPSTICK: NEGATIVE
KETONES UR: NEGATIVE mg/dL
Leukocytes, UA: NEGATIVE
Nitrite: NEGATIVE
PH: 7 (ref 5.0–8.0)
PROTEIN: NEGATIVE mg/dL
Specific Gravity, Urine: 1.008 (ref 1.005–1.030)

## 2016-02-01 LAB — BASIC METABOLIC PANEL
Anion gap: 6 (ref 5–15)
BUN: 14 mg/dL (ref 6–20)
CALCIUM: 8.9 mg/dL (ref 8.9–10.3)
CO2: 26 mmol/L (ref 22–32)
CREATININE: 0.83 mg/dL (ref 0.61–1.24)
Chloride: 105 mmol/L (ref 101–111)
GFR calc Af Amer: >60 mL/min (ref >60)
GLUCOSE: 96 mg/dL (ref 65–99)
Potassium: 3.8 mmol/L (ref 3.5–5.1)
Sodium: 137 mmol/L (ref 135–145)

## 2016-02-01 LAB — VALPROIC ACID LEVEL: Valproic Acid Lvl: 13 ug/mL — ABNORMAL LOW (ref 50.0–100.0)
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
## 2016-02-01 NOTE — Discharge Instructions (Signed)
1. Medications: usual home medications - but please discuss them with your provider as adjustments may be necessary 2. Treatment: rest, drink plenty of fluids,  3. Follow Up: Please followup with your primary doctor in 1-2 days for discussion of your diagnoses and further evaluation after today's visit; if you do not have a primary care doctor use the resource guide provided to find one; Please return to the ER for worsening symptoms, altered mental status or other concerns

## 2016-02-01 NOTE — ED Notes (Signed)
Bed: WA06 Expected date:  Expected time:  Means of arrival:  Comments: 

## 2016-02-21 ENCOUNTER — Encounter (HOSPITAL_COMMUNITY): Payer: Self-pay | Admitting: Emergency Medicine

## 2016-02-21 ENCOUNTER — Emergency Department (HOSPITAL_COMMUNITY)
Admission: EM | Admit: 2016-02-21 | Discharge: 2016-02-21 | Discharge status: Against Medical Advice | Payer: Medicare Other | Attending: Emergency Medicine | Admitting: Emergency Medicine

## 2016-02-21 DIAGNOSIS — E039 Hypothyroidism, unspecified: Secondary | ICD-10-CM | POA: Diagnosis not present

## 2016-02-21 DIAGNOSIS — F0391 Unspecified dementia with behavioral disturbance: Secondary | ICD-10-CM | POA: Diagnosis not present

## 2016-02-21 DIAGNOSIS — Z79899 Other long term (current) drug therapy: Secondary | ICD-10-CM | POA: Insufficient documentation

## 2016-02-21 DIAGNOSIS — Z87891 Personal history of nicotine dependence: Secondary | ICD-10-CM | POA: Insufficient documentation

## 2016-02-21 DIAGNOSIS — F918 Other conduct disorders: Secondary | ICD-10-CM | POA: Diagnosis present

## 2016-02-21 LAB — COMPREHENSIVE METABOLIC PANEL
ALT: 14 U/L — ABNORMAL LOW (ref 17–63)
ANION GAP: 6 (ref 5–15)
AST: 15 U/L (ref 15–41)
Albumin: 3.8 g/dL (ref 3.5–5.0)
Alkaline Phosphatase: 52 U/L (ref 38–126)
BILIRUBIN TOTAL: 0.5 mg/dL (ref 0.3–1.2)
BUN: 13 mg/dL (ref 6–20)
CALCIUM: 9.2 mg/dL (ref 8.9–10.3)
CO2: 28 mmol/L (ref 22–32)
Chloride: 105 mmol/L (ref 101–111)
Creatinine, Ser: 0.7 mg/dL (ref 0.61–1.24)
GFR calc Af Amer: >60 mL/min (ref >60)
Glucose, Bld: 95 mg/dL (ref 65–99)
POTASSIUM: 3.7 mmol/L (ref 3.5–5.1)
Sodium: 139 mmol/L (ref 135–145)
TOTAL PROTEIN: 6.5 g/dL (ref 6.5–8.1)

## 2016-02-21 LAB — CBC WITH DIFFERENTIAL/PLATELET
Basophils Absolute: 0 10*3/uL (ref 0.0–0.1)
Basophils Relative: 0 %
Eosinophils Absolute: 0.1 10*3/uL (ref 0.0–0.7)
Eosinophils Relative: 2 %
HEMATOCRIT: 36.3 % — AB (ref 39.0–52.0)
Hemoglobin: 12.3 g/dL — ABNORMAL LOW (ref 13.0–17.0)
LYMPHS PCT: 25 %
Lymphs Abs: 1.4 10*3/uL (ref 0.7–4.0)
MCH: 29.6 pg (ref 26.0–34.0)
MCHC: 33.9 g/dL (ref 30.0–36.0)
MCV: 87.5 fL (ref 78.0–100.0)
MONO ABS: 0.6 10*3/uL (ref 0.1–1.0)
MONOS PCT: 11 %
NEUTROS ABS: 3.4 10*3/uL (ref 1.7–7.7)
Neutrophils Relative %: 62 %
Platelets: 169 10*3/uL (ref 150–400)
RBC: 4.15 MIL/uL — ABNORMAL LOW (ref 4.22–5.81)
RDW: 12.9 % (ref 11.5–15.5)
WBC: 5.5 10*3/uL (ref 4.0–10.5)

## 2016-02-21 LAB — ETHANOL

## 2016-02-21 NOTE — ED Provider Notes (Signed)
[facility]-EMER[facility]Y DEPT Provider Note   CSN: [id number] Arrival date & time: 02/21/16  0053  By signing my name below, I, Majel Homer, attest that this documentation has been prepared under the direction and in the presence of Brylie Sneath, MD . Electronically Signed: Majel Homer, Scribe. 02/21/2016. 3:05 AM.  History   Chief Complaint Chief Complaint  Patient presents with  . Aggressive Behavior   The history is provided by the patient, the spouse and a relative. No language interpreter was used.   HPI Comments: Bruce Fleming is a 70 y.o. male with PMHx of Alzheimer's disease, HLD and thyroid disease, who presents to the Emergency Department from [facility] for an evaluation of aggressive and combative behavior that began a few weeks ago and worsened today. Pt's daughter reports she was contacted from his facility about pt acting aggressively and threatening other residents and staff; pt is calm and responsive now in the ED. Per wife, pt recently began taking estrogen due to pt "jacking off" in front of staff and residents and they find it offensive. She notes pt is also taking  lorazepam, Depakote, paroxetine and aspartame. Pt denies dysuria.   Past Medical History:  Diagnosis Date  . Alzheimer's dementia   . Hyperlipidemia   . Thyroid disease     Patient Active Problem List   Diagnosis Date Noted  . Hypothyroid 04/07/2013  . Hyperlipidemia 04/07/2013  . Memory loss, short term 04/07/2013   History reviewed. No pertinent surgical history.  Home Medications    Prior to Admission medications   Medication Sig Start Date End Date Taking? Authorizing Provider  divalproex (DEPAKOTE) 125 MG DR tablet Take 250 mg by mouth 2 (two) times daily.    Yes Historical Provider, MD  estradiol (ESTRACE) 0.5 MG tablet Take 0.5 mg by mouth daily.   Yes Historical Provider, MD  LORazepam (ATIVAN) 0.5 MG tablet Take 1 tablet (0.5 mg total) by mouth 2 (two) times daily as needed for  anxiety. This should be faxed to pharm at [phone] or facility [phone]. Patient taking differently: Take 0.5 mg by mouth 3 (three) times daily.  02/24/15  Yes Elvina Sidle, MD  PARoxetine (PAXIL) 20 MG tablet Take 20 mg by mouth daily.   Yes Historical Provider, MD    Family History Family History  Problem Relation Age of Onset  . Alzheimer's disease Mother   . Alzheimer's disease Maternal Grandmother     Social History Social History  Substance Use Topics  . Smoking status: Former Smoker    Years: 10.00    Types: Cigarettes    Quit date: 06/16/1978  . Smokeless tobacco: Never Used  . Alcohol use Yes     Comment: SPECIAL OCCASSION     Allergies   Review of patient's allergies indicates no known allergies.  Review of Systems Review of Systems  Genitourinary: Negative for dysuria.  Psychiatric/Behavioral: Positive for behavioral problems.  All other systems reviewed and are negative.   Physical Exam Updated Vital Signs BP 102/62   Pulse 62   Temp 97.8 F (36.6 C) (Oral)   Resp 20   SpO2 96%   Physical Exam  Constitutional: He appears well-developed and well-nourished.  Well appearing and congenial    HENT:  Head: Normocephalic.  Right Ear: External ear normal.  Left Ear: External ear normal.  Mouth/Throat: Oropharynx is clear and moist. No oropharyngeal exudate.  Moist mucous membranes, no exudate No sign of bruising, no battle sign or racoon eyes  Eyes:  Conjunctivae and EOM are normal. Pupils are equal, round, and reactive to light. Right eye exhibits no discharge. Left eye exhibits no discharge. No scleral icterus.  Neck: Normal range of motion. Neck supple. No JVD present. No tracheal deviation present.  Trachea is midline. No stridor or carotid bruits.  Cardiovascular: Normal rate, regular rhythm, normal heart sounds and intact distal pulses.   No murmur heard. Pulmonary/Chest: Effort normal and breath sounds normal. No stridor. No respiratory  distress. He has no wheezes. He has no rales.  Lungs CTA bilaterally.  Abdominal: Soft. Bowel sounds are normal. He exhibits no distension. There is no tenderness. There is no rebound and no guarding.  Musculoskeletal: Normal range of motion. He exhibits no edema or tenderness.  All compartments soft  Lymphadenopathy:    He has no cervical adenopathy.  Neurological: He is alert. He has normal reflexes. He displays normal reflexes. He exhibits normal muscle tone.  Skin: Skin is warm and dry. Capillary refill takes less than 2 seconds.  Psychiatric: He has a normal mood and affect. His behavior is normal. His mood appears not anxious. His affect is not angry and not inappropriate. His speech is not rapid and/or pressured. He is not agitated and not aggressive.  Nursing note and vitals reviewed.  Vitals:   02/21/16 0102 02/21/16 0313  BP: 102/62 98/73  Pulse: 62 68  Resp: 20 20  Temp: 97.8 F (36.6 C)     ED Treatments / Results  Labs Results for orders placed or performed during the hospital encounter of 02/21/16  CBC with Differential/Platelet  Result Value Ref Range   WBC 5.5 4.0 - 10.5 K/uL   RBC 4.15 (L) 4.22 - 5.81 MIL/uL   Hemoglobin 12.3 (L) 13.0 - 17.0 g/dL   HCT 54.036.3 (L) 98.139.0 - 19.152.0 %   MCV 87.5 78.0 - 100.0 fL   MCH 29.6 26.0 - 34.0 pg   MCHC 33.9 30.0 - 36.0 g/dL   RDW 47.812.9 29.511.5 - 62.115.5 %   Platelets 169 150 - 400 K/uL   Neutrophils Relative % 62 %   Neutro Abs 3.4 1.7 - 7.7 K/uL   Lymphocytes Relative 25 %   Lymphs Abs 1.4 0.7 - 4.0 K/uL   Monocytes Relative 11 %   Monocytes Absolute 0.6 0.1 - 1.0 K/uL   Eosinophils Relative 2 %   Eosinophils Absolute 0.1 0.0 - 0.7 K/uL   Basophils Relative 0 %   Basophils Absolute 0.0 0.0 - 0.1 K/uL  Comprehensive metabolic panel  Result Value Ref Range   Sodium 139 135 - 145 mmol/L   Potassium 3.7 3.5 - 5.1 mmol/L   Chloride 105 101 - 111 mmol/L   CO2 28 22 - 32 mmol/L   Glucose, Bld 95 65 - 99 mg/dL   BUN 13 6 - 20  mg/dL   Creatinine, Ser 3.080.70 0.61 - 1.24 mg/dL   Calcium 9.2 8.9 - 65.710.3 mg/dL   Total Protein 6.5 6.5 - 8.1 g/dL   Albumin 3.8 3.5 - 5.0 g/dL   AST 15 15 - 41 U/L   ALT 14 (L) 17 - 63 U/L   Alkaline Phosphatase 52 38 - 126 U/L   Total Bilirubin 0.5 0.3 - 1.2 mg/dL   GFR calc non Af Amer >60 >60 mL/min   GFR calc Af Amer >60 >60 mL/min   Anion gap 6 5 - 15  Ethanol  Result Value Ref Range   Alcohol, Ethyl (B) <5 <5 mg/dL   No  results found.   Radiology No results found.  Procedures Procedures  DIAGNOSTIC STUDIES:  Oxygen Saturation is 96% on RA, normal by my interpretation.    COORDINATION OF CARE:  3:03 AM Discussed treatment plan with pts family at bedside and pts family agreed to plan.  Medications Ordered in ED Medications - No data to display   Initial Impression / Assessment and Plan / ED Course  I have reviewed the triage vital signs and the nursing notes.  Pertinent labs & imaging results that were available during my care of the patient were reviewed by me and considered in my medical decision making (see chart for details).   I personally performed the services described in this documentation, which was scribed in my presence. The recorded information has been reviewed and is accurate.    Wife took EDP aside outside patient's room and stated she wanted patient made comfort care and no one will do this because the patient is physically healthy. She wants something done because she "knows he is doing this because he is suffering." EDP explained that the patient was sent in to ensure there is no reason why these outbursts are happening and that we would check labs and if there was something that needed we would do so.    Final Clinical Impressions(s) / ED Diagnoses   Wife walked into the doctor's office while EDP was working on charts stating she was the patient's POA.  EDP was polite and immediately closed EPIC.  Patient's wife and angry and would like patient  made comfort care though he is physically health and is very well appearing. EDP stated is patient was sent in for aggressive behavior at the nursing home, removing medications would make that worse.  EDP explained that patient was sent in to ensure there was no physiologic reason why he has these witnessed behaviors at the facility.  Patient is very calm and polite and alert on exam.  Patient would like patient euthanized as she states he is suffering and EDP explained that we do not do that in the ED.  EDP politely stated she understands wife's frustration with both the nursing home sending him in and with Alzheimer's. EDP apologized that she was having to go through this.  Wife then stated again she was his power of attorney and she went through this with the patient's mother some years ago and the patient is suffering and she wants him to be comfort care.  EDP asked wife if she had spoken to social work or the house doctor at the facility regarding her concerns. She has not yet done so at this time.  EDP encouraged her to speak with social work and doctor and to voice her concerns with his condition and behavior.    Was seeing other patient's and informed wife is disgruntled and at nursing station.  She wants to know what is taking so long.  EDP explained labs were just sent recently and take some time to return and we are waiting for the results to see if there is anything to intervene upon or requiring admission.  She states she wants to take him back to [facility] and wants to take him out of there as it only happens there and he is always well when he comes to the hospital.  EDP stated she empathized with wife and daughter's frustration. Wife then stated with charge (Tiffany) and Miss Eunice Blase present she wants to "take him to a right to die state."  Daughter then stated she does not want that for him.  EDP again apologized that they have had to go through this multiple times. Wife then signs the  patient out without all labs returning.   New Prescriptions New Prescriptions   No medications on file     Finnleigh Marchetti, MD 02/21/16 [phone]

## 2016-02-21 NOTE — ED Triage Notes (Signed)
Brought in by EMS [facility] with c/o aggressive behavior.   Pt has been aggressive and combative towards staff and other patients at the facility--- pt pushing and hitting staff/residents.  Has Alzheimer's dementia, with hx of violent behavior.

## 2016-02-21 NOTE — ED Notes (Signed)
Bed: WA09 Expected date:  Expected time:  Means of arrival:  Comments: EMS aggressive [facility]

## 2016-03-05 ENCOUNTER — Encounter (HOSPITAL_COMMUNITY): Payer: Self-pay | Admitting: Emergency Medicine

## 2016-03-05 ENCOUNTER — Emergency Department (HOSPITAL_COMMUNITY)
Admission: EM | Admit: 2016-03-05 | Discharge: 2016-03-06 | Disposition: A | Discharge status: Home / Self Care | Payer: Medicare Other | Attending: Emergency Medicine | Admitting: Emergency Medicine

## 2016-03-05 ENCOUNTER — Emergency Department (HOSPITAL_COMMUNITY): Payer: Medicare Other

## 2016-03-05 DIAGNOSIS — E039 Hypothyroidism, unspecified: Secondary | ICD-10-CM | POA: Insufficient documentation

## 2016-03-05 DIAGNOSIS — Z008 Encounter for other general examination: Secondary | ICD-10-CM

## 2016-03-05 DIAGNOSIS — F0281 Dementia in other diseases classified elsewhere with behavioral disturbance: Secondary | ICD-10-CM | POA: Diagnosis not present

## 2016-03-05 DIAGNOSIS — G308 Other Alzheimer's disease: Secondary | ICD-10-CM | POA: Diagnosis not present

## 2016-03-05 DIAGNOSIS — R456 Violent behavior: Secondary | ICD-10-CM | POA: Insufficient documentation

## 2016-03-05 DIAGNOSIS — Z046 Encounter for general psychiatric examination, requested by authority: Secondary | ICD-10-CM | POA: Diagnosis present

## 2016-03-05 DIAGNOSIS — G309 Alzheimer's disease, unspecified: Secondary | ICD-10-CM

## 2016-03-05 DIAGNOSIS — Z79899 Other long term (current) drug therapy: Secondary | ICD-10-CM | POA: Insufficient documentation

## 2016-03-05 DIAGNOSIS — Z87891 Personal history of nicotine dependence: Secondary | ICD-10-CM | POA: Insufficient documentation

## 2016-03-05 DIAGNOSIS — F02818 Dementia in other diseases classified elsewhere, unspecified severity, with other behavioral disturbance: Secondary | ICD-10-CM | POA: Diagnosis present

## 2016-03-05 LAB — CBC WITH DIFFERENTIAL/PLATELET
BASOS ABS: 0 10*3/uL (ref 0.0–0.1)
BASOS PCT: 1 %
EOS ABS: 0.1 10*3/uL (ref 0.0–0.7)
Eosinophils Relative: 2 %
HEMATOCRIT: 35.9 % — AB (ref 39.0–52.0)
Hemoglobin: 12.1 g/dL — ABNORMAL LOW (ref 13.0–17.0)
Lymphocytes Relative: 26 %
Lymphs Abs: 1.2 10*3/uL (ref 0.7–4.0)
MCH: 29.1 pg (ref 26.0–34.0)
MCHC: 33.7 g/dL (ref 30.0–36.0)
MCV: 86.3 fL (ref 78.0–100.0)
MONO ABS: 0.5 10*3/uL (ref 0.1–1.0)
Monocytes Relative: 10 %
NEUTROS ABS: 3 10*3/uL (ref 1.7–7.7)
Neutrophils Relative %: 61 %
PLATELETS: 198 10*3/uL (ref 150–400)
RBC: 4.16 MIL/uL — ABNORMAL LOW (ref 4.22–5.81)
RDW: 12.8 % (ref 11.5–15.5)
WBC: 4.8 10*3/uL (ref 4.0–10.5)

## 2016-03-05 LAB — COMPREHENSIVE METABOLIC PANEL
ALT: 24 U/L (ref 17–63)
ANION GAP: 6 (ref 5–15)
AST: 31 U/L (ref 15–41)
Albumin: 4 g/dL (ref 3.5–5.0)
Alkaline Phosphatase: 59 U/L (ref 38–126)
BILIRUBIN TOTAL: 0.3 mg/dL (ref 0.3–1.2)
BUN: 19 mg/dL (ref 6–20)
CO2: 27 mmol/L (ref 22–32)
Calcium: 9.3 mg/dL (ref 8.9–10.3)
Chloride: 107 mmol/L (ref 101–111)
Creatinine, Ser: 0.9 mg/dL (ref 0.61–1.24)
GFR calc Af Amer: >60 mL/min (ref >60)
Glucose, Bld: 90 mg/dL (ref 65–99)
POTASSIUM: 4.2 mmol/L (ref 3.5–5.1)
Sodium: 140 mmol/L (ref 135–145)
TOTAL PROTEIN: 6.7 g/dL (ref 6.5–8.1)

## 2016-03-05 LAB — ETHANOL

## 2016-03-05 LAB — VALPROIC ACID LEVEL: VALPROIC ACID LVL: 19 ug/mL — AB (ref 50.0–100.0)

## 2016-03-05 MED ORDER — LORAZEPAM 0.5 MG PO TABS
0.5000 mg | ORAL_TABLET | Freq: Three times a day (TID) | ORAL | Status: DC
  Administered 2016-03-06 (×2): 0.5 mg via ORAL
  Filled 2016-03-05 (×2): qty 1

## 2016-03-05 MED ORDER — DIVALPROEX SODIUM 250 MG PO DR TAB
250.0000 mg | DELAYED_RELEASE_TABLET | Freq: Two times a day (BID) | ORAL | Status: DC
  Administered 2016-03-06 (×2): 250 mg via ORAL
  Filled 2016-03-05 (×2): qty 1

## 2016-03-05 MED ORDER — PAROXETINE HCL 20 MG PO TABS
20.0000 mg | ORAL_TABLET | Freq: Every day | ORAL | Status: DC
  Administered 2016-03-06: 20 mg via ORAL
  Filled 2016-03-05: qty 1

## 2016-03-05 MED ORDER — ESTRADIOL 1 MG PO TABS
0.5000 mg | ORAL_TABLET | Freq: Every day | ORAL | Status: DC
  Administered 2016-03-06: 0.5 mg via ORAL
  Filled 2016-03-05: qty 0.5

## 2016-03-05 NOTE — ED Notes (Signed)
Pt stated that he did not have to urinate at this moment.

## 2016-03-05 NOTE — ED Provider Notes (Addendum)
[facility]-EMER[facility] DEPT Provider Note   CSN: [id number] Arrival date & time: 03/05/16  1734     History   Chief Complaint Chief Complaint  Patient presents with  . IVC    HPI Bruce Fleming is a 70 y.o. male.  HPI Patient has been accepted at [facility] for treatment of psychiatric illness. Patient has history of Alzheimer's dementia with violent behavior. He is being taken under IVC from [facility]. The patient was brought into the emergency department to get medical clearance for transport to [facility]. Police report they cannot transport the patient across [employer] without higher authority clearance. The patient's placement and acceptance at [facility] facility has already been done by [facility] nursing home. Patient does not have any acute complaints. He reports he's just been a bad guy and he needs to be thrown over the bridge. Report on IVC is that the patient has been very aggressive and hostile to staff with physical threats and violence as well as sexual agression. Past Medical History:  Diagnosis Date  . Alzheimer's dementia   . Hyperlipidemia   . Thyroid disease     Patient Active Problem List   Diagnosis Date Noted  . Hypothyroid 04/07/2013  . Hyperlipidemia 04/07/2013  . Memory loss, short term 04/07/2013    History reviewed. No pertinent surgical history.     Home Medications    Prior to Admission medications   Medication Sig Start Date End Date Taking? Authorizing Provider  divalproex (DEPAKOTE) 125 MG DR tablet Take 250 mg by mouth 2 (two) times daily.     Historical Provider, MD  estradiol (ESTRACE) 0.5 MG tablet Take 0.5 mg by mouth daily.    Historical Provider, MD  LORazepam (ATIVAN) 0.5 MG tablet Take 1 tablet (0.5 mg total) by mouth 2 (two) times daily as needed for anxiety. This should be faxed to pharm at [phone] or facility [phone]. Patient taking differently: Take 0.5 mg by mouth 3 (three) times daily.   02/24/15   Elvina Sidle, MD  PARoxetine (PAXIL) 20 MG tablet Take 20 mg by mouth daily.    Historical Provider, MD    Family History Family History  Problem Relation Age of Onset  . Alzheimer's disease Mother   . Alzheimer's disease Maternal Grandmother     Social History Social History  Substance Use Topics  . Smoking status: Former Smoker    Years: 10.00    Types: Cigarettes    Quit date: 06/16/1978  . Smokeless tobacco: Never Used  . Alcohol use Yes     Comment: SPECIAL OCCASSION     Allergies   Review of patient's allergies indicates no known allergies.   Review of Systems Review of Systems 10 Systems reviewed and are negative for acute change except as noted in the HPI. Level V caveat patient has Alzheimer's dementia and is not likely reliable for review of systems.  Physical Exam Updated Vital Signs There were no vitals taken for this visit.  Physical Exam  Constitutional: He appears well-developed and well-nourished. No distress.  At this time, patient is alert and cheerful. He is sitting on the stretcher and stands up and amylase without difficulty. He is currently following commands. No respiratory distress.  HENT:  Head: Normocephalic and atraumatic.  Right Ear: External ear normal.  Left Ear: External ear normal.  Nose: Nose normal.  Mouth/Throat: Oropharynx is clear and moist.  Eyes: EOM are normal. Pupils are equal, round, and reactive to light.  Neck: Neck supple.  Cardiovascular: Normal rate, regular rhythm, normal heart sounds and intact distal pulses.   Pulmonary/Chest: Effort normal and breath sounds normal.  Abdominal: Soft. He exhibits no distension. There is no tenderness.  Musculoskeletal: Normal range of motion. He exhibits no edema, tenderness or deformity.  Patient does not have any peripheral edema. He is using all extremities purposely and a coordinated fashion. He is ambulatory with a nonantalgic gait.  Neurological: He is alert. No  cranial nerve deficit. He exhibits normal muscle tone. Coordination normal.  Patient is alert and verbally interactive but his comments and speech are not situationally appropriate. Findings are consistent with dementia. He does not show signs of acute delirium.  Skin: Skin is warm and dry.  Psychiatric: He has a normal mood and affect.  At the time of examination the patient is cooperative and cheerful.     ED Treatments / Results  Labs (all labs ordered are listed, but only abnormal results are displayed) Labs Reviewed - No data to display  EKG  EKG Interpretation None       Radiology No results found.  Procedures Procedures (including critical care time)  Medications Ordered in ED Medications - No data to display   Initial Impression / Assessment and Plan / ED Course  I have reviewed the triage vital signs and the nursing notes.  Pertinent labs & imaging results that were available during my care of the patient were reviewed by me and considered in my medical decision making (see chart for details).  Clinical Course    Final Clinical Impressions(s) / ED Diagnoses   Final diagnoses:  Medical clearance for psychiatric admission  Alzheimer's dementia with behavioral disturbance   Patient has had several presentations to this emergency Department for behavioral disturbance associated with Alzheimer's dementia. Patient's medical condition has remained stable. Diagnostic studies have remained stable throughout repeat evaluations. Today the patient clinically well appearance. He was ostensibly brought to the emergency department because he needed medical clearance for the transportation to [facility] facility which [facility]. Arrives with [facility]C paperwork from [facility]. Patient is medically cleared for transportation to his definitive treatment facility.  Addendum: Now, clarification is that: [facility] but had established that there was bed availability. Report is that the patient is sent to the emergency department with IVC paperwork for social work placement and medical clearance. Patient is medically cleared. Will await TTS evaluation and plan for placement. New Prescriptions New Prescriptions   No medications on file     Arby BarretteMarcy Kamare Caspers, MD 03/05/16 1759    Arby BarretteMarcy Derrell Milanes, MD 03/05/16 2209

## 2016-03-05 NOTE — ED Notes (Signed)
Pt given activity apron as diversion method.

## 2016-03-05 NOTE — ED Triage Notes (Signed)
Per IVC paperwork work-aggressive towards staff-paperwork states he is to be transported to [facility]

## 2016-03-05 NOTE — BH Assessment (Signed)
Tele Assessment Note   Bruce Fleming is an 70 y.o. male who presents to the ED under IVC. Pt was originally brought in due to aggressive behavior at his current nursing home. Pt denies any S/I, H/I and stated "once in a while they come up but there were no reservations." The pt was experiencing word salad as the responses to the questions he was being asked were not pertaining to the actual questions. . The pt denies A/V hallucinations however he stated "I don't see anything but I see them once in a while and I just stay away from those or I would smack them around." During the assessment, the pt was cooperative and making eye-contact. Pt was not oriented. When the pt was asked his DOB, he stated "I was born in 1922" and pt was unsure of the location. Pt denies SA and stated "no, no, no" when asked if he has ever abused drugs or alcohol. Pt was inconsistent in his thought process and presented a flight of ideas during the assessment.  According to previous documentation, the pt was expected to already have a bed at [facility], however while speaking with Matilde Haymaker, RN he expressed that the pt does not have a bed at [facility]. Per Nira Conn, FNP pt does not meet inpatient criteria and recommends AM psych eval. Matilde Haymaker, RN has been notified.   Diagnosis: Deffered  Past Medical History:  Past Medical History:  Diagnosis Date   Alzheimer's dementia    Hyperlipidemia    Thyroid disease     History reviewed. No pertinent surgical history.  Family History:  Family History  Problem Relation Age of Onset   Alzheimer's disease Mother    Alzheimer's disease Maternal Grandmother     Social History:  reports that he quit smoking about 37 years ago. His smoking use included Cigarettes. He quit after 10.00 years of use. He has never used smokeless tobacco. He reports that he drinks alcohol. His drug history is not on file.  Additional Social History:  Alcohol / Drug  Use Pain Medications: Pt denies abuse Prescriptions: Pt denies abuse Over the Counter: Pt denies abuse History of alcohol / drug use?: No history of alcohol / drug abuse  CIWA: CIWA-Ar BP: 111/97 Pulse Rate: 60 COWS:    PATIENT STRENGTHS: (choose at least two) Financial means Motivation for treatment/growth Work skills  Allergies: No Known Allergies  Home Medications:  (Not in a hospital admission)  OB/GYN Status:  No LMP for male patient.  General Assessment Data Location of Assessment: [facility] ED TTS Assessment: In system Is this a Tele or Face-to-Face Assessment?: Face-to-Face Is this an Initial Assessment or a Re-assessment for this encounter?: Initial Assessment Marital status: Single Is patient pregnant?: No Pregnancy Status: No Living Arrangements: Other (Comment) [facility]) Can pt return to current living arrangement?: Yes Admission Status: Involuntary Is patient capable of signing voluntary admission?: Yes Referral Source: Self/Family/Friend Insurance type: Medicare     Crisis Care Plan Living Arrangements: Other (Comment) [facility]) Name of Psychiatrist: none provided Name of Therapist: none provided  Education Status Is patient currently in school?: No Highest grade of school patient has completed: high school  Risk to self with the past 6 months Suicidal Ideation: No Has patient been a risk to self within the past 6 months prior to admission? : No Suicidal Intent: No Has patient had any suicidal intent within the past 6 months prior to admission? : No  Is patient at risk for suicide?: No Suicidal Plan?: No Has patient had any suicidal plan within the past 6 months prior to admission? : No Access to Means: No What has been your use of drugs/alcohol within the last 12 months?: pt denies Previous Attempts/Gestures: No Triggers for Past Attempts: None known Intentional Self Injurious Behavior: None Family Suicide  History: No Recent stressful life event(s): Other (Comment) (unknown) Persecutory voices/beliefs?: Yes Depression: No Depression Symptoms:  (denies) Substance abuse history and/or treatment for substance abuse?: No Suicide prevention information given to non-admitted patients: Not applicable  Risk to Others within the past 6 months Homicidal Ideation: No Does patient have any lifetime risk of violence toward others beyond the six months prior to admission? : No Thoughts of Harm to Others: No Current Homicidal Intent: No Current Homicidal Plan: No Access to Homicidal Means: No History of harm to others?:  (according to notes, pt has been aggressive at nursing home) Assessment of Violence: None Noted Does patient have access to weapons?: No Criminal Charges Pending?: No Does patient have a court date: No Is patient on probation?: No  Psychosis Hallucinations: Visual (pt reports he sees things but then later retracted statement) Delusions: Unspecified  Mental Status Report Appearance/Hygiene: Unremarkable Eye Contact: Good Motor Activity: Freedom of movement, Rigidity Speech: Incoherent, Slurred, Word salad Level of Consciousness: Alert Mood: Anxious Affect: Inconsistent with thought content Anxiety Level: Minimal Thought Processes: Flight of Ideas, Irrelevant Judgement: Impaired Orientation: Not oriented (pt stated he was born in 1922, did not know location) Obsessive Compulsive Thoughts/Behaviors: None  Cognitive Functioning Concentration: Poor Memory: Recent Impaired, Remote Impaired IQ: Average Insight: Poor Impulse Control: Fair Appetite: Good Sleep: No Change Total Hours of Sleep: 8 Vegetative Symptoms: None  ADLScreening [facility]) Patient's cognitive ability adequate to safely complete daily activities?: Yes Patient able to express need for assistance with ADLs?: Yes Independently performs ADLs?: Yes (appropriate for developmental  age)  Prior Inpatient Therapy Prior Inpatient Therapy: No  Prior Outpatient Therapy Prior Outpatient Therapy: No Does patient have an ACCT team?: No Does patient have Intensive In-House Services?  : No Does patient have [facility] services? : No Does patient have P4CC services?: No  ADL Screening (condition at time of admission) Patient's cognitive ability adequate to safely complete daily activities?: Yes Is the patient deaf or have difficulty hearing?: No Does the patient have difficulty seeing, even when wearing glasses/contacts?: No Does the patient have difficulty concentrating, remembering, or making decisions?: No Patient able to express need for assistance with ADLs?: Yes Does the patient have difficulty dressing or bathing?: No Independently performs ADLs?: Yes (appropriate for developmental age) Does the patient have difficulty walking or climbing stairs?: No Weakness of Legs: Left (pt reports pain in his right hip) Weakness of Arms/Hands: None  Home Assistive Devices/Equipment Home Assistive Devices/Equipment: None    Abuse/Neglect Assessment (Assessment to be complete while patient is alone) Physical Abuse: Denies Verbal Abuse: Denies Sexual Abuse: Denies Exploitation of patient/patient's resources: Denies Self-Neglect: Denies     Merchant navy officer (For Healthcare) Does patient have an advance directive?: No Would patient like information on creating an advanced directive?: No - patient declined information    Additional Information 1:1 In Past 12 Months?: No CIRT Risk: No Elopement Risk: No Does patient have medical clearance?: Yes     Disposition: According to previous documentation, the pt was expected to already have a bed at [facility], however while speaking with Matilde Haymaker, RN he expressed that the pt does  [facility]. Per Nira ConnJason Berry, FNP pt does not meet inpatient criteria and recommends AM psych eval. Matilde HaymakerAtilade A Owolabi, RN  has been notified.  Disposition Initial Assessment Completed for this Encounter: Yes Disposition of Patient: Other dispositions Other disposition(s): Other (Comment) (AM psych eval per Nira ConnJason Berry, FNP )  Karolee OhsAquicha R Duff 03/05/2016 10:55 PM

## 2016-03-05 NOTE — ED Notes (Signed)
Bed: WA15 Expected date:  Expected time:  Means of arrival:  Comments: [employer] 

## 2016-03-05 NOTE — Progress Notes (Signed)
CSW contacted [facility], to confirmed if they made arrangements for patient to be transported to [facility]. [facility] stated they knew of bed availability but were unsure if patient had a bed there. [facility] were instructed per [facility] to [facility] patient and the ED would evaluate him.   CSW contacted [facility] to confirm and they informed CSW that they do have beds available but will need patient to be medically cleared, evaluated by psych, and recommended for inpatient treatment. CSW relayed information to EDP.   Stacy GardnerErin Lamont Glasscock, LCSWA Clinical Social Worker [phone]

## 2016-03-06 DIAGNOSIS — G308 Other Alzheimer's disease: Secondary | ICD-10-CM

## 2016-03-06 DIAGNOSIS — F02818 Dementia in other diseases classified elsewhere, unspecified severity, with other behavioral disturbance: Secondary | ICD-10-CM | POA: Diagnosis present

## 2016-03-06 DIAGNOSIS — G309 Alzheimer's disease, unspecified: Secondary | ICD-10-CM

## 2016-03-06 DIAGNOSIS — F0281 Dementia in other diseases classified elsewhere with behavioral disturbance: Secondary | ICD-10-CM | POA: Diagnosis present

## 2016-03-06 LAB — RAPID URINE DRUG SCREEN, HOSP PERFORMED
AMPHETAMINES: NOT DETECTED
AMPHETAMINES: NOT DETECTED
BENZODIAZEPINES: NOT DETECTED
Barbiturates: NOT DETECTED
Barbiturates: NOT DETECTED
Benzodiazepines: POSITIVE — AB
COCAINE: NOT DETECTED
Cocaine: NOT DETECTED
OPIATES: NOT DETECTED
OPIATES: NOT DETECTED
TETRAHYDROCANNABINOL: NOT DETECTED
Tetrahydrocannabinol: NOT DETECTED

## 2016-03-06 LAB — CBG MONITORING, ED: Glucose-Capillary: 89 mg/dL (ref 65–99)

## 2016-03-06 NOTE — ED Notes (Signed)
Patient ambulated in hallway with minimal assistance.  

## 2016-03-06 NOTE — ED Notes (Signed)
Pt out of room in hallway, he is pleasantly confused, sts "feels stiff from lying down", ambulated in the hallways with minimal assistance.

## 2016-03-06 NOTE — [facility] Suicide Risk Assessment (Signed)
Suicide Risk Assessment  Discharge Assessment   [facility]   Principal Problem: Alzheimer's dementia with behavioral disturbance Discharge Diagnoses:  Patient Active Problem List   Diagnosis Date Noted  . Alzheimer's dementia with behavioral disturbance [G30.8] 03/06/2016    Priority: High  . Hypothyroid [E03.9] 04/07/2013  . Hyperlipidemia [E78.5] 04/07/2013  . Memory loss, short term [R41.3] 04/07/2013    Total Time spent with patient: 45 minutes  Musculoskeletal: Strength & Muscle Tone: within normal limits Gait & Station: normal Patient leans: N/A  Psychiatric Specialty Exam: Physical Exam  Constitutional: He is oriented to person, place, and time. He appears well-developed and well-nourished.  HENT:  Head: Normocephalic.  Neck: Normal range of motion.  Respiratory: Effort normal.  Musculoskeletal: Normal range of motion.  Neurological: He is alert and oriented to person, place, and time.  Skin: Skin is warm and dry.  Psychiatric: He has a normal mood and affect. His speech is normal and behavior is normal. Thought content normal. Cognition and memory are impaired. He expresses impulsivity.    Review of Systems  Constitutional: Negative.   HENT: Negative.   Eyes: Negative.   Respiratory: Negative.   Cardiovascular: Negative.   Gastrointestinal: Negative.   Genitourinary: Negative.   Musculoskeletal: Negative.   Skin: Negative.   Neurological: Negative.   Endo/Heme/Allergies: Negative.   Psychiatric/Behavioral: Positive for memory loss.    Blood pressure 114/74, pulse 61, temperature 98.2 F (36.8 C), temperature source Oral, resp. rate 18, SpO2 100 %.There is no height or weight on file to calculate BMI.  General Appearance: Casual  Eye Contact:  Good  Speech:  Normal Rate  Volume:  Normal  Mood:  Euthymic  Affect:  Congruent  Thought Process:  Coherent and Descriptions of Associations: Intact  Orientation:  Other:  time and  place  Thought Content:  WDL  Suicidal Thoughts:  No  Homicidal Thoughts:  No  Memory:  Immediate;   Fair Recent;   Fair Remote;   Fair  Judgement:  Fair  Insight:  Lacking  Psychomotor Activity:  Normal  Concentration:  Concentration: Fair and Attention Span: Fair  Recall:  Fis[employer]Fair  Fund of Knowledge:  Fair  Language:  Good  Akathisia:  No  Handed:  Right  AIMS (if indicated):     Assets:  Leisure Time Physical Health Resilience  ADL's:  Intact  Cognition:  Impaired,  Mild  Sleep:       Mental Status Per Nursing Assessment::   On Admission:   agitation  Demographic Factors:  Male, Age 69 or older and Caucasian  Loss Factors: NA  Historical Factors: Impulsivity  Risk Reduction Factors:   Sense of responsibility to family, Living with another person, especially a relative and Positive social support  Continued Clinical Symptoms:  Memory loss associated with his dementia  Cognitive Features That Contribute To Risk:  None    Suicide Risk:  Minimal: No identifiable suicidal ideation.  Patients presenting with no risk factors but with morbid ruminations; may be classified as minimal risk based on the severity of the depressive symptoms    Plan Of Care/Follow-up recommendations:  Activity:  as tolerated Diet:  heart healthy diet  Florabelle Cardin, NP 03/06/2016, 11:17 AM

## 2016-03-06 NOTE — Progress Notes (Addendum)
9:17am- Return call to Charge RN and informed that patient had not been seen by psychiatry at the time.   Patient's case discussed with Director of Social Work during [employer] meeting. CSW informed her that patient had been psychiatrically cleared to [facility] per Psychiatrist. She stated to inform the facility that patient did not meet inpatient criteria.   CSW was informed after patient patient being evaluated that he is psychiatrically cleared and should be sent [facility]. CSW spoke with Shella MaximMelvin Jones, Resident Care Director and informed him that patient was psychiatrically cleared, did not meet inpatient criteria, and would need to return to their facility. CSW explained to him the meaning of inpatient criteria. He stated patient had been discharged from their facility, not allowed to return,  and he is a danger to their residents.   Psychiatrist, NP, and Consulting civil engineerCharge RN updated after call to [facility].   Elenore PaddyLaVonia Vicke Plotner, LCSWA [phone] ED CSW 03/06/2016 11:22 AM

## 2016-03-06 NOTE — Progress Notes (Signed)
CSW met with Asst. Director of Social Work and discussed patient's case. Asst. Director was informed of conversation with Kathrene Alu, Resident Care Director and that patient was psychiatrically cleared and did not meet inpatient criteria. Call came in while in meeting with Asst. Director from [place], Teacher, music from [facility]. She stated they will not be able to accept patient back at the facility due to his behaviors and sexual aggression. CSW informed her that patient had been psychiatrically cleared and he would need to return to the facility. CSW asked if any criminal charges had been taken against patient and she stated she was unsure. She inquired about medication changes and was informed she could speak with the Nurse to obtain information. Asst. Director spoke with her via phone as well to inform her that the patient would need to return to the facility. He informed her that patient did not meet inpatient criteria and he was psychiatrically cleared. After her discussion with him, she stated she would need to "hang up" to find patient a place to go.  Asst. Director and [place] staffed case with Nurse and Agricultural consultant. Nurse will call and give report and update CSW. Call received from Nurse stating they will accept patient back at the facility. NP and Asst. Director of SW Psychologist, sport and exercise of Social Work updated.   Genice Rouge [phone] ED CSW 03/06/2016 1:56 PM

## 2016-03-06 NOTE — Consult Note (Signed)
[facility] Consult   Reason for Consult:  Agitation  Referring Physician:  EDP Patient Identification: Bruce Fleming MRN:  [id number] Principal Diagnosis: Alzheimer's dementia with behavioral disturbance Diagnosis:   Patient Active Problem List   Diagnosis Date Noted  . Alzheimer's dementia with behavioral disturbance [G30.8] 03/06/2016    Priority: High  . Hypothyroid [E03.9] 04/07/2013  . Hyperlipidemia [E78.5] 04/07/2013  . Memory loss, short term [R41.3] 04/07/2013    Total Time spent with patient: 45 minutes  Subjective:   Bruce Fleming is a 70 y.o. male patient does not warrant admission, psychiatrically cleared.  HPI:  70 yo male who was sent her by his SNF for agitation and aggression.  However, in the ED he is calm and cooperative.  He engages easily in conversation, confused at time but pleasant.  Denies suicidal/homicidal ideations, hallucinations, and alcohol/drug abuse.  Stable for discharge back to his SNF.  Past Psychiatric History: dementia  Risk to Self: Suicidal Ideation: No Suicidal Intent: No Is patient at risk for suicide?: No Suicidal Plan?: No Access to Means: No What has been your use of drugs/alcohol within the last 12 months?: pt denies Triggers for Past Attempts: None known Intentional Self Injurious Behavior: None Risk to Others: Homicidal Ideation: No Thoughts of Harm to Others: No Current Homicidal Intent: No Current Homicidal Plan: No Access to Homicidal Means: No History of harm to others?:  (according to notes, pt has been aggressive at nursing home) Assessment of Violence: None Noted Does patient have access to weapons?: No Criminal Charges Pending?: No Does patient have a court date: No Prior Inpatient Therapy: Prior Inpatient Therapy: No Prior Outpatient Therapy: Prior Outpatient Therapy: No Does patient have an ACCT team?: No Does patient have Intensive In-House Services?  : No Does patient have  [facility] services? : No Does patient have P4[facility] services?: No  Past Medical History:  Past Medical History:  Diagnosis Date  . Alzheimer's dementia   . Hyperlipidemia   . Thyroid disease    History reviewed. No pertinent surgical history. Family History:  Family History  Problem Relation Age of Onset  . Alzheimer's disease Mother   . Alzheimer's disease Maternal Grandmother    Family Psychiatric  History: none Social History:  History  Alcohol Use  . Yes    Comment: SPECIAL OCCASSION     History  Drug use: Unknown    Social History   Social History  . Marital status: Married    Spouse name: N/A  . Number of children: N/A  . Years of education: N/A   Social History Main Topics  . Smoking status: Former Smoker    Years: 10.00    Types: Cigarettes    Quit date: 06/16/1978  . Smokeless tobacco: Never Used  . Alcohol use Yes     Comment: SPECIAL OCCASSION  . Drug use: Unknown  . Sexual activity: Not Asked   Other Topics Concern  . None   Social History Narrative  . None   Additional Social History:    Allergies:  No Known Allergies  Labs:  Results for orders placed or performed during the hospital encounter of 03/05/16 (from the past 48 hour(s))  Comprehensive metabolic panel     Status: None   Collection Time: 03/05/16  7:07 PM  Result Value Ref Range   Sodium 140 135 - 145 mmol/L   Potassium 4.2 3.5 - 5.1 mmol/L   Chloride 107 101 - 111 mmol/L   CO2 27 22 -  32 mmol/L   Glucose, Bld 90 65 - 99 mg/dL   BUN 19 6 - 20 mg/dL   Creatinine, Ser 0.90 0.61 - 1.24 mg/dL   Calcium 9.3 8.9 - 10.3 mg/dL   Total Protein 6.7 6.5 - 8.1 g/dL   Albumin 4.0 3.5 - 5.0 g/dL   AST 31 15 - 41 U/L   ALT 24 17 - 63 U/L   Alkaline Phosphatase 59 38 - 126 U/L   Total Bilirubin 0.3 0.3 - 1.2 mg/dL   GFR calc non Af Amer >60 >60 mL/min   GFR calc Af Amer >60 >60 mL/min    Comment: (NOTE) The eGFR has been calculated using the CKD EPI equation. This calculation has not been  validated in all clinical situations. eGFR's persistently <60 mL/min signify possible Chronic Kidney Disease.    Anion gap 6 5 - 15  Ethanol     Status: None   Collection Time: 03/05/16  7:07 PM  Result Value Ref Range   Alcohol, Ethyl (B) <5 <5 mg/dL    Comment:        LOWEST DETECTABLE LIMIT FOR SERUM ALCOHOL IS 5 mg/dL FOR MEDICAL PURPOSES ONLY   CBC with Diff     Status: Abnormal   Collection Time: 03/05/16  7:07 PM  Result Value Ref Range   WBC 4.8 4.0 - 10.5 K/uL   RBC 4.16 (L) 4.22 - 5.81 MIL/uL   Hemoglobin 12.1 (L) 13.0 - 17.0 g/dL   HCT 35.9 (L) 39.0 - 52.0 %   MCV 86.3 78.0 - 100.0 fL   MCH 29.1 26.0 - 34.0 pg   MCHC 33.7 30.0 - 36.0 g/dL   RDW 12.8 11.5 - 15.5 %   Platelets 198 150 - 400 K/uL   Neutrophils Relative % 61 %   Neutro Abs 3.0 1.7 - 7.7 K/uL   Lymphocytes Relative 26 %   Lymphs Abs 1.2 0.7 - 4.0 K/uL   Monocytes Relative 10 %   Monocytes Absolute 0.5 0.1 - 1.0 K/uL   Eosinophils Relative 2 %   Eosinophils Absolute 0.1 0.0 - 0.7 K/uL   Basophils Relative 1 %   Basophils Absolute 0.0 0.0 - 0.1 K/uL  Valproic acid level     Status: Abnormal   Collection Time: 03/05/16 10:33 PM  Result Value Ref Range   Valproic Acid Lvl 19 (L) 50.0 - 100.0 ug/mL  Urine rapid drug screen (hosp performed)not at [facility]     Status: Abnormal   Collection Time: 03/06/16  6:37 AM  Result Value Ref Range   Opiates NONE DETECTED NONE DETECTED   Cocaine NONE DETECTED NONE DETECTED   Benzodiazepines POSITIVE (A) NONE DETECTED   Amphetamines NONE DETECTED NONE DETECTED   Tetrahydrocannabinol NONE DETECTED NONE DETECTED   Barbiturates NONE DETECTED NONE DETECTED    Comment:        DRUG SCREEN FOR MEDICAL PURPOSES ONLY.  IF CONFIRMATION IS NEEDED FOR ANY PURPOSE, NOTIFY LAB WITHIN 5 DAYS.        LOWEST DETECTABLE LIMITS FOR URINE DRUG SCREEN Drug Class       Cutoff (ng/mL) Amphetamine      1000 Barbiturate      200 Benzodiazepine   992 Tricyclics       426 Opiates           300 Cocaine          300 THC              50  Current Facility-Administered Medications  Medication Dose Route Frequency Provider Last Rate Last Dose  . divalproex (DEPAKOTE) DR tablet 250 mg  250 mg Oral BID Charlesetta Shanks, MD   250 mg at 03/06/16 0917  . estradiol (ESTRACE) tablet 0.5 mg  0.5 mg Oral Daily Charlesetta Shanks, MD   0.5 mg at 03/06/16 0916  . LORazepam (ATIVAN) tablet 0.5 mg  0.5 mg Oral TID Charlesetta Shanks, MD   0.5 mg at 03/06/16 0916  . PARoxetine (PAXIL) tablet 20 mg  20 mg Oral Daily Charlesetta Shanks, MD   20 mg at 03/06/16 0175   Current Outpatient Prescriptions  Medication Sig Dispense Refill  . divalproex (DEPAKOTE) 125 MG DR tablet Take 250 mg by mouth 2 (two) times daily.     Marland Kitchen LORazepam (ATIVAN) 0.5 MG tablet Take 1 tablet (0.5 mg total) by mouth 2 (two) times daily as needed for anxiety. This should be faxed to pharm at [phone] or facility [phone]. (Patient taking differently: Take 0.5 mg by mouth 3 (three) times daily. ) 60 tablet 5  . PARoxetine (PAXIL) 20 MG tablet Take 20 mg by mouth daily.    . traZODone (DESYREL) 100 MG tablet Take 100 mg by mouth at bedtime.    Marland Kitchen estradiol (ESTRACE) 0.5 MG tablet Take 0.5 mg by mouth daily.      Musculoskeletal: Strength & Muscle Tone: within normal limits Gait & Station: normal Patient leans: N/A  Psychiatric Specialty Exam: Physical Exam  Constitutional: He is oriented to person, place, and time. He appears well-developed and well-nourished.  HENT:  Head: Normocephalic.  Neck: Normal range of motion.  Respiratory: Effort normal.  Musculoskeletal: Normal range of motion.  Neurological: He is alert and oriented to person, place, and time.  Skin: Skin is warm and dry.  Psychiatric: He has a normal mood and affect. His speech is normal and behavior is normal. Thought content normal. Cognition and memory are impaired. He expresses impulsivity.    Review of Systems  Constitutional: Negative.   HENT:  Negative.   Eyes: Negative.   Respiratory: Negative.   Cardiovascular: Negative.   Gastrointestinal: Negative.   Genitourinary: Negative.   Musculoskeletal: Negative.   Skin: Negative.   Neurological: Negative.   Endo/Heme/Allergies: Negative.   Psychiatric/Behavioral: Positive for memory loss.    Blood pressure 114/74, pulse 61, temperature 98.2 F (36.8 C), temperature source Oral, resp. rate 18, SpO2 100 %.There is no height or weight on file to calculate BMI.  General Appearance: Casual  Eye Contact:  Good  Speech:  Normal Rate  Volume:  Normal  Mood:  Euthymic  Affect:  Congruent  Thought Process:  Coherent and Descriptions of Associations: Intact  Orientation:  Other:  time and place  Thought Content:  WDL  Suicidal Thoughts:  No  Homicidal Thoughts:  No  Memory:  Immediate;   Fair Recent;   Fair Remote;   Fair  Judgement:  Fair  Insight:  Lacking  Psychomotor Activity:  Normal  Concentration:  Concentration: Fair and Attention Span: Fair  Recall:  [employer] of Knowledge:  Fair  Language:  Good  Akathisia:  No  Handed:  Right  AIMS (if indicated):     Assets:  Leisure Time Physical Health Resilience  ADL's:  Intact  Cognition:  Impaired,  Mild  Sleep:        Treatment Plan Summary: Daily contact with patient to assess and evaluate symptoms and progress in treatment, Medication management and Plan alzheimer's dementia with behavioral  disturbance:  -Crisis stabilization -Medication management:  Medical medications continued along with his Depakote 250 mg BID for mood, Ativan 0.5 mg BID for anxiety, Paxil 20 mg daily for depression, and Trazodone 100 mg at bedtime for sleep. -Individual counseling  Disposition: No evidence of imminent risk to self or others at present.    Waylan Boga, NP 03/06/2016 11:10 AM  Patient seen face-to-face for psychiatric evaluation, chart reviewed and case discussed with the physician extender and developed treatment plan.  Reviewed the information documented and agree with the treatment plan. Corena Pilgrim, MD

## 2016-03-06 NOTE — ED Notes (Signed)
[facility] [place] [facility] will [facility]

## 2016-05-28 ENCOUNTER — Emergency Department (HOSPITAL_COMMUNITY)
Admission: EM | Admit: 2016-05-28 | Discharge: 2016-05-28 | Disposition: A | Discharge status: Home / Self Care | Payer: Medicare Other | Attending: Emergency Medicine | Admitting: Emergency Medicine

## 2016-05-28 ENCOUNTER — Encounter (HOSPITAL_COMMUNITY): Payer: Self-pay | Admitting: Emergency Medicine

## 2016-05-28 DIAGNOSIS — E039 Hypothyroidism, unspecified: Secondary | ICD-10-CM | POA: Insufficient documentation

## 2016-05-28 DIAGNOSIS — R21 Rash and other nonspecific skin eruption: Secondary | ICD-10-CM

## 2016-05-28 DIAGNOSIS — G309 Alzheimer's disease, unspecified: Secondary | ICD-10-CM | POA: Insufficient documentation

## 2016-05-28 DIAGNOSIS — Z87891 Personal history of nicotine dependence: Secondary | ICD-10-CM | POA: Insufficient documentation

## 2016-05-28 DIAGNOSIS — Z79899 Other long term (current) drug therapy: Secondary | ICD-10-CM | POA: Diagnosis not present

## 2016-05-28 LAB — CBC WITH DIFFERENTIAL/PLATELET
Basophils Absolute: 0 10*3/uL (ref 0.0–0.1)
Basophils Relative: 1 %
EOS ABS: 0.4 10*3/uL (ref 0.0–0.7)
EOS PCT: 8 %
HCT: 39.3 % (ref 39.0–52.0)
Hemoglobin: 13.3 g/dL (ref 13.0–17.0)
LYMPHS ABS: 1.6 10*3/uL (ref 0.7–4.0)
Lymphocytes Relative: 31 %
MCH: 30.2 pg (ref 26.0–34.0)
MCHC: 33.8 g/dL (ref 30.0–36.0)
MCV: 89.3 fL (ref 78.0–100.0)
MONO ABS: 0.5 10*3/uL (ref 0.1–1.0)
MONOS PCT: 9 %
Neutro Abs: 2.5 10*3/uL (ref 1.7–7.7)
Neutrophils Relative %: 51 %
PLATELETS: 171 10*3/uL (ref 150–400)
RBC: 4.4 MIL/uL (ref 4.22–5.81)
RDW: 13.7 % (ref 11.5–15.5)
WBC: 4.9 10*3/uL (ref 4.0–10.5)

## 2016-05-28 LAB — COMPREHENSIVE METABOLIC PANEL
ALT: 18 U/L (ref 17–63)
AST: 21 U/L (ref 15–41)
Albumin: 3.8 g/dL (ref 3.5–5.0)
Alkaline Phosphatase: 68 U/L (ref 38–126)
Anion gap: 6 (ref 5–15)
BUN: 15 mg/dL (ref 6–20)
CHLORIDE: 105 mmol/L (ref 101–111)
CO2: 29 mmol/L (ref 22–32)
CREATININE: 0.89 mg/dL (ref 0.61–1.24)
Calcium: 9.1 mg/dL (ref 8.9–10.3)
GFR calc Af Amer: >60 mL/min (ref >60)
Glucose, Bld: 95 mg/dL (ref 65–99)
Potassium: 4.5 mmol/L (ref 3.5–5.1)
SODIUM: 140 mmol/L (ref 135–145)
Total Bilirubin: 0.8 mg/dL (ref 0.3–1.2)
Total Protein: 6.9 g/dL (ref 6.5–8.1)

## 2016-05-28 MED ORDER — DIPHENHYDRAMINE HCL 25 MG PO CAPS
50.0000 mg | ORAL_CAPSULE | Freq: Once | ORAL | Status: AC
  Administered 2016-05-28: 50 mg via ORAL
  Filled 2016-05-28: qty 2

## 2016-05-28 NOTE — Discharge Instructions (Signed)
Bruce Fleming can take Benadryl 50 mg every 6 hours as needed for itch. Continue the triamcinolone cream as directed. Keep his scheduled appointment with dermatologist. Return if concern for any reason

## 2016-05-28 NOTE — ED Triage Notes (Signed)
PT RECEIVED FROM HOME VIA EMS FOR RECURRENT SCABS TO BILATERAL ARMS AND LEGS X1 MONTH. PER EMS, THE PT HAS BEEN SEEN BY HIS PCP FOR THE SAME. PT HAS A HX OF DEMENTIA.

## 2016-05-28 NOTE — ED Triage Notes (Signed)
Family at bedside. PA advised family and patient that a full assessment will be completed as discussed

## 2016-05-28 NOTE — ED Triage Notes (Signed)
Pt reports a one day hx of itching bumps on both arms. Red raised "bumps" noted on both arms. Denies any other locations. Family in waiting room per EMS

## 2016-05-28 NOTE — ED Notes (Signed)
Pt given sandwich, cheese stick and applesauce with Diet Coke

## 2016-05-28 NOTE — ED Triage Notes (Signed)
Wife and daughter at bedside. Family restated that pt was seen yesterday for a one month hx of itching. Also reported recent ankle swelling and penile drainage. Wife is requesting that an  assessment be completed and treatment options be discussed. PA advised

## 2016-05-28 NOTE — ED Provider Notes (Signed)
[facility]-EMERGENCY DEPT Provider Note   CSN: [id number] Arrival date & time: 05/28/16  1250     History   Chief Complaint Chief Complaint  Patient presents with  . Pruritis    small red spots on arms x 1 day    HPI Philipp OvensKenneth Joseph Corella is a 70 y.o. male. Level V caveat dementia history is obtained from patient's daughter and wife who accompany him. Patient developed diffuse rash, pruritic 1.5 weeks ago. His wife noticed that his feet have been swollen for the past 2 days. Patient denies pain anywhere. He's had no fever. He was seen by his PCP this past week and prescribed triamcinolone cream, to be applied twice daily he's had 1 dose thus far. He has scheduled appointment for dermatologist 1.5 weeks from now. Patient has been eating well. No other associated symptoms. He is also been treated with Benadryl. HPI  Past Medical History:  Diagnosis Date  . Alzheimer's dementia   . Hyperlipidemia   . Thyroid disease     Patient Active Problem List   Diagnosis Date Noted  . Alzheimer's dementia with behavioral disturbance 03/06/2016  . Hypothyroid 04/07/2013  . Hyperlipidemia 04/07/2013  . Memory loss, short term 04/07/2013    History reviewed. No pertinent surgical history.     Home Medications    Prior to Admission medications   Medication Sig Start Date End Date Taking? Authorizing Provider  divalproex (DEPAKOTE) 125 MG DR tablet Take 250 mg by mouth 2 (two) times daily.    Yes Historical Provider, MD  divalproex (DEPAKOTE) 500 MG DR tablet Take 500 mg by mouth at bedtime.   Yes Historical Provider, MD  donepezil (ARICEPT) 5 MG tablet Take 5 mg by mouth at bedtime.   Yes Historical Provider, MD  QUEtiapine (SEROQUEL) 100 MG tablet Take 100 mg by mouth 3 (three) times daily.   Yes Historical Provider, MD  QUEtiapine (SEROQUEL) 50 MG tablet Take 50 mg by mouth at bedtime.   Yes Historical Provider, MD  sertraline (ZOLOFT) 25 MG tablet Take 25 mg by mouth daily.   Yes  Historical Provider, MD  triamcinolone cream (KENALOG) 0.5 % Apply 1 application topically 2 (two) times daily.   Yes Historical Provider, MD  LORazepam (ATIVAN) 0.5 MG tablet Take 1 tablet (0.5 mg total) by mouth 2 (two) times daily as needed for anxiety. This should be faxed to pharm at [phone] or facility [phone]. Patient not taking: Reported on 05/28/2016 02/24/15   Elvina SidleKurt Lauenstein, MD    Family History Family History  Problem Relation Age of Onset  . Alzheimer's disease Mother   . Alzheimer's disease Maternal Grandmother     Social History Social History  Substance Use Topics  . Smoking status: Former Smoker    Years: 10.00    Types: Cigarettes    Quit date: 06/16/1978  . Smokeless tobacco: Never Used  . Alcohol use Yes     Comment: SPECIAL OCCASSION     Allergies   Patient has no known allergies.   Review of Systems Review of Systems  Unable to perform ROS: Dementia  Cardiovascular: Positive for leg swelling.  Skin: Positive for rash.     Physical Exam Updated Vital Signs BP 110/73 (BP Location: Right Arm)   Pulse (!) 59   Temp 98.1 F (36.7 C) (Oral)   Resp 16   Ht 6\' 1"  (1.854 m)   Wt 243 lb (110.2 kg)   SpO2 98%   BMI 32.06 kg/m   Physical Exam  Constitutional: He appears well-developed and well-nourished. No distress.  HENT:  Head: Normocephalic and atraumatic.  No mucosal lesion  Eyes: Conjunctivae are normal. Pupils are equal, round, and reactive to light.  Neck: Neck supple. No tracheal deviation present. No thyromegaly present.  Cardiovascular: Normal rate, regular rhythm, normal heart sounds and intact distal pulses.   No murmur heard. Pulmonary/Chest: Effort normal and breath sounds normal.  Abdominal: Soft. Bowel sounds are normal. He exhibits no distension. There is no tenderness.  Genitourinary: Penis normal.  Musculoskeletal: Normal range of motion. He exhibits no edema or tenderness.  No peripheral edema  Neurological: He is  alert. No cranial nerve deficit. Coordination normal.  Skin: Skin is warm and dry. No rash noted.  Pinpoint scabbed lesions on trunk, scrotum, and extremities not involving palms or soles. Not involving his back or buttocks  Psychiatric: He has a normal mood and affect.  Nursing note and vitals reviewed.    ED Treatments / Results  Labs (all labs ordered are listed, but only abnormal results are displayed) Labs Reviewed  COMPREHENSIVE METABOLIC PANEL  CBC WITH DIFFERENTIAL/PLATELET  RPR    EKG  EKG Interpretation None       Radiology No results found.  Procedures Procedures (including critical care time)  Medications Ordered in ED Medications - No data to display  Results for orders placed or performed during the hospital encounter of 05/28/16  Comprehensive metabolic panel  Result Value Ref Range   Sodium 140 135 - 145 mmol/L   Potassium 4.5 3.5 - 5.1 mmol/L   Chloride 105 101 - 111 mmol/L   CO2 29 22 - 32 mmol/L   Glucose, Bld 95 65 - 99 mg/dL   BUN 15 6 - 20 mg/dL   Creatinine, Ser 1.470.89 0.61 - 1.24 mg/dL   Calcium 9.1 8.9 - 82.910.3 mg/dL   Total Protein 6.9 6.5 - 8.1 g/dL   Albumin 3.8 3.5 - 5.0 g/dL   AST 21 15 - 41 U/L   ALT 18 17 - 63 U/L   Alkaline Phosphatase 68 38 - 126 U/L   Total Bilirubin 0.8 0.3 - 1.2 mg/dL   GFR calc non Af Amer >60 >60 mL/min   GFR calc Af Amer >60 >60 mL/min   Anion gap 6 5 - 15  CBC with Differential/Platelet  Result Value Ref Range   WBC 4.9 4.0 - 10.5 K/uL   RBC 4.40 4.22 - 5.81 MIL/uL   Hemoglobin 13.3 13.0 - 17.0 g/dL   HCT 56.239.3 13.039.0 - 86.552.0 %   MCV 89.3 78.0 - 100.0 fL   MCH 30.2 26.0 - 34.0 pg   MCHC 33.8 30.0 - 36.0 g/dL   RDW 78.413.7 69.611.5 - 29.515.5 %   Platelets 171 150 - 400 K/uL   Neutrophils Relative % 51 %   Neutro Abs 2.5 1.7 - 7.7 K/uL   Lymphocytes Relative 31 %   Lymphs Abs 1.6 0.7 - 4.0 K/uL   Monocytes Relative 9 %   Monocytes Absolute 0.5 0.1 - 1.0 K/uL   Eosinophils Relative 8 %   Eosinophils Absolute  0.4 0.0 - 0.7 K/uL   Basophils Relative 1 %   Basophils Absolute 0.0 0.0 - 0.1 K/uL   No results found. Initial Impression / Assessment and Plan / ED Course  I have reviewed the triage vital signs and the nursing notes.  Pertinent labs & imaging results that were available during my care of the patient were reviewed by me and considered in  my medical decision making (see chart for details). Patient a minister Benadryl while here for itch. 8 PM he is alert and appropriate and appears in no distress Clinical Course     Rashes felt to be nonspecific. No evidence of kidney or liver dysfunction. RPR pending. Family instructed to continue triamcinolone and Benadryl. Keep scheduled point with dermatologist  Final Clinical Impressions(s) / ED Diagnoses  Diagnosis nonspecific rash Final diagnoses:  None    New Prescriptions New Prescriptions   No medications on file     Doug Sou, MD 05/28/16 2001

## 2016-05-28 NOTE — ED Provider Notes (Signed)
70 year old Caucasian male with a past medical history significant for Alzheimer's dementia, hyperlipidemia, thyroid disease that presents to the ED today with his wife for multiple complaints. Patient was initially leveled a four due to itchy bumps on his arms, penis, scrotum, legs bilaterally. He was seen by his PCP yesterday and given some steroid cream and follow-up with dermatology next week. At that time the doctor was unaware of the seeping wounds on his scrotum. Wife also states that he has developed right leg swelling that she noticed today. I do noticed swelling of the right leg up to the level of the knee. Patient does have bilateral erythematous bumps to arms and legs bilaterally. Did not undressed patient to check penis and scrotum for discharging rash. I spoke with the wife who states the patient is early onset Alzheimer's and has a DO NOT RESUSCITATE.  They would like the diagnoses of what is wrong with him today including his swollen right leg and if treatment would help to initate. The patient is demented and difficult to obtain story from including review of systems and physical PE. Wife would like for us to do further testing to understand what is going on with patient. I have instructed nursing staff that in order to do this he would need lab work and imaging. Patient also needs to be fully undressed for medical exam I feel that this is not appropriate in fast track. I discussed with Dr. Dalene SeltzerSchlossman patient's complaint and if patient and his family were looking for comfort care I could treat his pruritus in the ED however they would like blood work to know why his right leg swelling. I have informed nursing staff to be patient to the back with room becomes available.   Rise MuKenneth T Leaphart, PA-C 05/28/16 1350    Alvira MondayErin Schlossman, MD 06/01/16 [phone]

## 2016-05-29 LAB — RPR: RPR Ser Ql: NONREACTIVE

## 2016-08-25 ENCOUNTER — Encounter (HOSPITAL_COMMUNITY): Payer: Self-pay

## 2016-08-25 ENCOUNTER — Emergency Department (HOSPITAL_COMMUNITY): Payer: Medicare Other

## 2016-08-25 ENCOUNTER — Observation Stay (HOSPITAL_COMMUNITY)
Admission: EM | Admit: 2016-08-25 | Discharge: 2016-08-26 | Disposition: A | Discharge status: Home / Self Care | Payer: Medicare Other | Attending: Internal Medicine | Admitting: Internal Medicine

## 2016-08-25 DIAGNOSIS — R404 Transient alteration of awareness: Secondary | ICD-10-CM

## 2016-08-25 DIAGNOSIS — F0281 Dementia in other diseases classified elsewhere with behavioral disturbance: Secondary | ICD-10-CM | POA: Insufficient documentation

## 2016-08-25 DIAGNOSIS — Z79899 Other long term (current) drug therapy: Secondary | ICD-10-CM | POA: Insufficient documentation

## 2016-08-25 DIAGNOSIS — G309 Alzheimer's disease, unspecified: Secondary | ICD-10-CM | POA: Diagnosis present

## 2016-08-25 DIAGNOSIS — R55 Syncope and collapse: Secondary | ICD-10-CM | POA: Diagnosis not present

## 2016-08-25 DIAGNOSIS — Z87891 Personal history of nicotine dependence: Secondary | ICD-10-CM | POA: Insufficient documentation

## 2016-08-25 DIAGNOSIS — R4020 Unspecified coma: Secondary | ICD-10-CM | POA: Diagnosis present

## 2016-08-25 DIAGNOSIS — Z66 Do not resuscitate: Secondary | ICD-10-CM | POA: Diagnosis not present

## 2016-08-25 DIAGNOSIS — R001 Bradycardia, unspecified: Secondary | ICD-10-CM | POA: Diagnosis not present

## 2016-08-25 DIAGNOSIS — I950 Idiopathic hypotension: Secondary | ICD-10-CM

## 2016-08-25 LAB — COMPREHENSIVE METABOLIC PANEL
ALBUMIN: 3.4 g/dL — AB (ref 3.5–5.0)
ALK PHOS: 53 U/L (ref 38–126)
ALT: 14 U/L — ABNORMAL LOW (ref 17–63)
AST: 16 U/L (ref 15–41)
Anion gap: 8 (ref 5–15)
BUN: 17 mg/dL (ref 6–20)
CALCIUM: 8.6 mg/dL — AB (ref 8.9–10.3)
CO2: 24 mmol/L (ref 22–32)
Chloride: 107 mmol/L (ref 101–111)
Creatinine, Ser: 0.92 mg/dL (ref 0.61–1.24)
GFR calc non Af Amer: >60 mL/min (ref >60)
GLUCOSE: 108 mg/dL — AB (ref 65–99)
POTASSIUM: 4 mmol/L (ref 3.5–5.1)
SODIUM: 139 mmol/L (ref 135–145)
Total Bilirubin: 0.5 mg/dL (ref 0.3–1.2)
Total Protein: 6.2 g/dL — ABNORMAL LOW (ref 6.5–8.1)

## 2016-08-25 LAB — URINALYSIS, ROUTINE W REFLEX MICROSCOPIC
Bacteria, UA: NONE SEEN
Bilirubin Urine: NEGATIVE
Glucose, UA: NEGATIVE mg/dL
Ketones, ur: 5 mg/dL — AB
LEUKOCYTES UA: NEGATIVE
Nitrite: NEGATIVE
Protein, ur: NEGATIVE mg/dL
SPECIFIC GRAVITY, URINE: 1.014 (ref 1.005–1.030)
pH: 6 (ref 5.0–8.0)

## 2016-08-25 LAB — TROPONIN I: Troponin I: <0.03 ng/mL (ref <0.03)

## 2016-08-25 LAB — CBC WITH DIFFERENTIAL/PLATELET
BASOS PCT: 1 %
Basophils Absolute: 0 10*3/uL (ref 0.0–0.1)
EOS ABS: 0.2 10*3/uL (ref 0.0–0.7)
EOS PCT: 4 %
HCT: 38.1 % — ABNORMAL LOW (ref 39.0–52.0)
Hemoglobin: 12.8 g/dL — ABNORMAL LOW (ref 13.0–17.0)
LYMPHS ABS: 1.7 10*3/uL (ref 0.7–4.0)
Lymphocytes Relative: 34 %
MCH: 29.6 pg (ref 26.0–34.0)
MCHC: 33.6 g/dL (ref 30.0–36.0)
MCV: 88 fL (ref 78.0–100.0)
MONOS PCT: 9 %
Monocytes Absolute: 0.5 10*3/uL (ref 0.1–1.0)
NEUTROS PCT: 52 %
Neutro Abs: 2.5 10*3/uL (ref 1.7–7.7)
Platelets: 151 10*3/uL (ref 150–400)
RBC: 4.33 MIL/uL (ref 4.22–5.81)
RDW: 12.7 % (ref 11.5–15.5)
WBC: 4.8 10*3/uL (ref 4.0–10.5)

## 2016-08-25 LAB — TSH: TSH: 4.118 u[IU]/mL (ref 0.350–4.500)

## 2016-08-25 MED ORDER — QUETIAPINE FUMARATE 25 MG PO TABS
100.0000 mg | ORAL_TABLET | Freq: Three times a day (TID) | ORAL | Status: DC
  Administered 2016-08-25 – 2016-08-26 (×3): 100 mg via ORAL
  Filled 2016-08-25 (×3): qty 4

## 2016-08-25 MED ORDER — ENOXAPARIN SODIUM 40 MG/0.4ML ~~LOC~~ SOLN
40.0000 mg | SUBCUTANEOUS | Status: DC
  Administered 2016-08-25: 40 mg via SUBCUTANEOUS
  Filled 2016-08-25: qty 0.4

## 2016-08-25 MED ORDER — DIVALPROEX SODIUM 250 MG PO DR TAB
250.0000 mg | DELAYED_RELEASE_TABLET | ORAL | Status: DC
  Administered 2016-08-26 (×2): 250 mg via ORAL
  Filled 2016-08-25 (×3): qty 1

## 2016-08-25 MED ORDER — SODIUM CHLORIDE 0.9 % IV BOLUS (SEPSIS)
1000.0000 mL | Freq: Once | INTRAVENOUS | Status: AC
  Administered 2016-08-25: 1000 mL via INTRAVENOUS

## 2016-08-25 MED ORDER — DIVALPROEX SODIUM 500 MG PO DR TAB
500.0000 mg | DELAYED_RELEASE_TABLET | Freq: Every day | ORAL | Status: DC
  Administered 2016-08-25: 500 mg via ORAL
  Filled 2016-08-25 (×3): qty 1

## 2016-08-25 MED ORDER — SERTRALINE HCL 25 MG PO TABS
25.0000 mg | ORAL_TABLET | Freq: Every day | ORAL | Status: DC
  Administered 2016-08-26: 25 mg via ORAL
  Filled 2016-08-25: qty 1

## 2016-08-25 MED ORDER — RISPERIDONE 0.5 MG PO TABS
0.5000 mg | ORAL_TABLET | Freq: Once | ORAL | Status: AC
  Administered 2016-08-25: 0.5 mg via ORAL
  Filled 2016-08-25: qty 1

## 2016-08-25 MED ORDER — QUETIAPINE FUMARATE 25 MG PO TABS: 50.0000 mg | ORAL_TABLET | Freq: Every day | ORAL | Status: DC

## 2016-08-25 MED ORDER — DONEPEZIL HCL 5 MG PO TABS
5.0000 mg | ORAL_TABLET | Freq: Every day | ORAL | Status: DC
  Administered 2016-08-25: 5 mg via ORAL
  Filled 2016-08-25: qty 1

## 2016-08-25 NOTE — ED Provider Notes (Signed)
[facility]-EM[facility]ENCY DEPT Provider Note   CSN: [id number] Arrival date & time: 08/25/16  1341     History   Chief Complaint Chief Complaint  Patient presents with  . Loss of Consciousness    HPI Philipp OvensKenneth Joseph Tarter is a 71 y.o. male.  HPI  Patient presents after an apparent episode of syncope. Patient is awake and alert, answering brief questions properly, but has little recall of events that preceded his arrival here. He specifically denies discomfort, pain, confusion, disorientation, nausea. However, the patient has a documented history of dementia, level V caveat.   Past Medical History:  Diagnosis Date  . Alzheimer's dementia   . Hyperlipidemia   . Thyroid disease     Patient Active Problem List   Diagnosis Date Noted  . Alzheimer's dementia with behavioral disturbance 03/06/2016  . Hypothyroid 04/07/2013  . Hyperlipidemia 04/07/2013  . Memory loss, short term 04/07/2013    History reviewed. No pertinent surgical history.     Home Medications    Prior to Admission medications   Medication Sig Start Date End Date Taking? Authorizing Provider  divalproex (DEPAKOTE) 125 MG DR tablet Take 250 mg by mouth 2 (two) times daily.     Historical Provider, MD  divalproex (DEPAKOTE) 500 MG DR tablet Take 500 mg by mouth at bedtime.    Historical Provider, MD  donepezil (ARICEPT) 5 MG tablet Take 5 mg by mouth at bedtime.    Historical Provider, MD  LORazepam (ATIVAN) 0.5 MG tablet Take 1 tablet (0.5 mg total) by mouth 2 (two) times daily as needed for anxiety. This should be faxed to pharm at [phone] or facility [phone]. Patient not taking: Reported on 05/28/2016 02/24/15   Elvina SidleKurt Lauenstein, MD  QUEtiapine (SEROQUEL) 100 MG tablet Take 100 mg by mouth 3 (three) times daily.    Historical Provider, MD  QUEtiapine (SEROQUEL) 50 MG tablet Take 50 mg by mouth at bedtime.    Historical Provider, MD  sertraline (ZOLOFT) 25 MG tablet Take 25 mg by mouth daily.     Historical Provider, MD  triamcinolone cream (KENALOG) 0.5 % Apply 1 application topically 2 (two) times daily.    Historical Provider, MD    Family History Family History  Problem Relation Age of Onset  . Alzheimer's disease Mother   . Alzheimer's disease Maternal Grandmother     Social History Social History  Substance Use Topics  . Smoking status: Former Smoker    Years: 10.00    Types: Cigarettes    Quit date: 06/16/1978  . Smokeless tobacco: Never Used  . Alcohol use Yes     Comment: SPECIAL OCCASSION     Allergies   Patient has no known allergies.   Review of Systems Review of Systems  Unable to perform ROS: Dementia     Physical Exam Updated Vital Signs There were no vitals taken for this visit.  Physical Exam  Constitutional: He appears well-developed. No distress.  HENT:  Head: Normocephalic and atraumatic.  Eyes: Conjunctivae and EOM are normal.  Cardiovascular: Normal rate and regular rhythm.   Pulmonary/Chest: Effort normal. No stridor. No respiratory distress.  Abdominal: He exhibits no distension.  Musculoskeletal: He exhibits no edema.  Neurological: He is alert.  Skin: Skin is warm and dry.  Psychiatric: He has a normal mood and affect. His speech is normal. Cognition and memory are impaired.  Nursing note and vitals reviewed.    ED Treatments / Results  Labs (all labs ordered are listed, but only abnormal  results are displayed) Labs Reviewed  CBC WITH DIFFERENTIAL/PLATELET - Abnormal; Notable for the following:       Result Value   Hemoglobin 12.8 (*)    HCT 38.1 (*)    All other components within normal limits  COMPREHENSIVE METABOLIC PANEL - Abnormal; Notable for the following:    Glucose, Bld 108 (*)    Calcium 8.6 (*)    Total Protein 6.2 (*)    Albumin 3.4 (*)    ALT 14 (*)    All other components within normal limits  TROPONIN I  POCT CBG (FASTING - GLUCOSE)-MANUAL ENTRY    EKG  EKG Interpretation  Date/Time:  Monday  August 25 2016 13:51:57 EDT Ventricular Rate:  45 PR Interval:    QRS Duration: 91 QT Interval:  441 QTC Calculation: 382 R Axis:   27 Text Interpretation:  Sinus bradycardia Borderline low voltage, extremity leads Otherwise normal ECG Confirmed by Gerhard Munch  MD [phone]) on 08/25/2016 1:59:55 PM       Procedures Procedures (including critical care time)  Medications Ordered in ED Medications - No data to display  Patient arrives with goals of care forms, specifically directing no additional IV fluids, no imaging, unless fractures are suspected. Initial Impression / Assessment and Plan / ED Course  I have reviewed the triage vital signs and the nursing notes.  Pertinent labs & imaging results that were available during my care of the patient were reviewed by me and considered in my medical decision making (see chart for details).  3:28 PM On repeat exam the patient is in no distress. He denies any complaints. 2 family members are not present, they note the patient has had one prior similar episodes, yesterday. No prior events. No recent medication changes, beyond cessation of 1 cholesterol medication. We had a lengthy conversation of the patient's history, presentation, concern for sick be versus seizure, with difficulty due to dementia. Patient remains mildly hypotensive, after 500 mL resuscitation. He will continue to receive IV fluids, admission pending.   Final Clinical Impressions(s) / ED Diagnoses  Syncope   Gerhard Munch, MD 08/25/16 1529

## 2016-08-25 NOTE — ED Triage Notes (Signed)
Patient was sitting in a chair when he lost consciousness for approx. 2 min according to family. HR of 48 and BP 90/54 after 500cc bolus with EMS. Hy. Of end stage alzheimers. Alert to person, place, but not time at baseline

## 2016-08-25 NOTE — ED Notes (Signed)
Moved patient to room 34 so he is more visible because he keeps trying to escape. He won't keep his gown on but he is covered. TV turned on, environment secured, and lights dimmed

## 2016-08-25 NOTE — H&P (Signed)
Date: 08/25/2016               Patient Name:  Bruce Fleming MRN: [id number]  DOB: July 11, 1945 Age / Sex: 71 y.o., male   PCP: Pcp Not In System         Medical Service: Internal Medicine Teaching Service         Attending Physician: Dr. Gust Rung, DO    First Contact: Dr. Noemi Chapel Pager: [phone]  Second Contact: Dr. Orest Dikes Pager: [phone]       After Hours (After 5p/  First Contact Pager: [phone]  weekends / holidays): Second Contact Pager: [phone]   Chief Complaint: loss of consciousness   History of Present Illness:  Bruce Fleming is a 71 year old male with alzheimer disease with behavioral disturbances, hypothyroidism and hyperlipidemia who presents for evaluation of loss of consciousness. Patient was only able to provide limited information due to his significant underlying dementia. The remainder of history was obtained through EDP and EMR. No family was present at bedside. Patient was seated in chair when he lost consciousness today for about 2 minutes. Family reports this happened yesterday as well. These episodes have been followed by confusion however resolved shortly after. Family informed EDP that he was not incontinent nor was there shaking. Patient reports he feels well and has not been sick recently. He denies any fever, chills, nausea, vomiting, chest pain, abdominal pain, diarrhea, constipation or any other symptoms. Denied any dysuria however reports going more often than usual lately.  In the ED, he was hypothermic at 96.9*, pulse 47, BP 102/68, respirations 16 and he was saturating 96% on RA. CMET essentially wnl. CBC essentially wnl as well. Trop negative. EKG with sinus bradycardia with rate of 45. CXR with low lung volumes and borderline cardiomegaly however without acute infiltrate or edema. He was placed on telemetry and subsequently admitted for observation. He had MOST form at bedside which indicated his DNR status as well as his desired  level of medical intervention. No IVF, no imaging (no chest x-rays), and use of antibiotics to be determined at physicians discretion.  Allergies: Allergies as of 08/25/2016  . (No Known Allergies)   Past Medical History:  Diagnosis Date  . Alzheimer's dementia   . Hyperlipidemia   . Thyroid disease    Family History: Mother with Alzheimer disease. Grandmother with Alzheimer disease  Social History: Former smoker with a 10 pack year history. Occasionally uses alcohol. No recreational drug use. Lives at home with his wife. Has a daughter.   Review of Systems: A complete ROS was negative except as per HPI.  Physical Exam: Blood pressure 113/81, pulse (!) 54, temperature (!) 96.9 F (36.1 C), temperature source Axillary, resp. rate 21, SpO2 99 %. General: Very pleasant caucasian male, resting comfortably in bed. In no acute distress. Well developed and well nourished.  HENT: EOMI. No conjunctival injection, icterus or ptosis. Oropharynx clear, mucous membranes moist.  Cardiovascular: Regular rate (70's) and rhythm. No murmur or rub appreciated. Pulmonary: CTA BL, no wheezing, crackles or rhonchi appreciated. Unlabored breathing. Good air movement throughout.  Abdomen: Soft and non-distended. ?TTP suprapubic. No guarding or rigidity. +bowel sounds.  Extremities: No peripheral edema noted BL. Intact distal pulses. No gross deformities. Skin: Warm, dry. No cyanosis.  Neuro: Alert but oriented only to self and city. (Did not know place, day, year). Strength and sensation grossly intact in BL UE and LE.  Psych: Pleasant and open to  conversation. Mood normal and affect was mood congruent. Responds to questions quickly however answers often do not make sense.    EKG: Sinus bradycardia, rate 45. Narrow complex.   CXR: Low lung volumes with borderline cardiomegaly. No acute infiltration or edema.   Assessment & Plan by Problem: Active Problems:   Loss of consciousness (HCC)  Loss of  Consciousness: This is a 71 year old male with advanced dementia due to Alzheimer Disease who presents without family for evaluation of 2 episodes of approximately 2 minutes of LOC. He has medical history significant for hypothyroidism and he is also on Keppra as an outpatient however does not recall if he has history of seizures. This could be added as a mood stabilizer. He does not appear to be on thyroid replacement. He denied feeling sick and feels like he is in good health. He denies any infectious symptoms except for perhaps some increased urination. He denies any chest pain or shortness of breath and overall he appears well. He is DNR and his scope of care is limited due to his desire for no IVF, no imaging and antibiotics at the physicians discretion. He presented hypothermic, hypotensive and bradycardic. Hypotension improved with 1L ns bolus. Will evaluate his thyroid function today with TSH. CXR was without infiltrate however will evaluate for other cause of infection such as UTI. Will keep patient on telemetry to evaluate for arrhythmia.  -TSH -Telemetry -UA  Advanced Alzheimer Disease with Behavioral Disturbances: On Aricept, depakote, seroquel and zoloft. He was pleasant and interactive today on examination however chart review shows he has had behavioral disturbances in the past leading to IVC. He was only oriented to self and city. It will be important to try to orient the patient in this new environment.  -Continue Donepezil 5mg , depakote, zoloft and seroquel  Code Status: DNR - NO IVF, NO IMAGING, NO ABX UNLESS DECIDED APPROPRIATE BY PHYSICIAN IVF: None Diet: Regular DVT prophylaxis: Lovenox  Dispo: Admit patient to Observation with expected length of stay less than 2 midnights.  SignedNoemi Chapel: Bruce Tandon, DO 08/25/2016, 4:13 PM  Pager: [phone]

## 2016-08-25 NOTE — ED Notes (Signed)
[facility]

## 2016-08-25 NOTE — ED Notes (Signed)
Report attempted 

## 2016-08-25 NOTE — ED Notes (Signed)
Patient found to be standing up trying to "get to the bank". He was redirected back to bed, given a snack and some water

## 2016-08-26 DIAGNOSIS — F0281 Dementia in other diseases classified elsewhere with behavioral disturbance: Secondary | ICD-10-CM

## 2016-08-26 DIAGNOSIS — G309 Alzheimer's disease, unspecified: Secondary | ICD-10-CM

## 2016-08-26 DIAGNOSIS — R55 Syncope and collapse: Secondary | ICD-10-CM

## 2016-08-26 DIAGNOSIS — Z66 Do not resuscitate: Secondary | ICD-10-CM

## 2016-08-26 DIAGNOSIS — G300 Alzheimer's disease with early onset: Secondary | ICD-10-CM | POA: Diagnosis not present

## 2016-08-26 DIAGNOSIS — Z820 Family history of epilepsy and other diseases of the nervous system: Secondary | ICD-10-CM

## 2016-08-26 DIAGNOSIS — R001 Bradycardia, unspecified: Secondary | ICD-10-CM | POA: Diagnosis not present

## 2016-08-26 DIAGNOSIS — Z87891 Personal history of nicotine dependence: Secondary | ICD-10-CM

## 2016-08-26 MED ORDER — HALOPERIDOL LACTATE 5 MG/ML IJ SOLN
5.0000 mg | Freq: Once | INTRAMUSCULAR | Status: AC
  Administered 2016-08-26: 5 mg via INTRAVENOUS
  Filled 2016-08-26: qty 1

## 2016-08-26 NOTE — Progress Notes (Signed)
Transitions of Care Pharmacy Note  Plan:  Educated on discharge medications --------------------------------------------- Bruce Fleming is an 71 y.o. male who presents with a chief complaint of loss of consciousness. In anticipation of discharge, pharmacy has reviewed this patient's prior to admission medication history, as well as current inpatient [facility].  Current medication indications, dosing, frequency, and notable side effects reviewed with patient and family. patient and family verbalized understanding of current inpatient medication regimen and is aware that the After Visit Summary when presented, will represent the most accurate medication list at discharge.   Bruce Fleming expressed no concerns. He only takes four medications and his wife and daughter are both knowledgeable about his regimen and help him take his medications.   Assessment: Understanding of regimen: good Understanding of indications: good Potential of compliance: good Barriers to Obtaining Medications: No  Patient instructed to contact inpatient pharmacy team with further questions or concerns if needed.    Time spent preparing for discharge counseling: 10 Time spent counseling patient: 10   Thank you for allowing pharmacy to be a part of this patient's care.  Bruce Fleming(Bruce Fleming), PharmD  PGY1 Pharmacy Resident Pager: [phone] 08/26/2016 5:12 PM

## 2016-08-26 NOTE — Consult Note (Signed)
CARDIOLOGY CONSULT NOTE   Patient ID: Bruce Fleming MRN: [id number] DOB/AGE: 70-Sep-1947 70 y.o.  Admit date: 08/25/2016  Primary Physician   Pcp Not In System Primary Cardiologist   New to Dr. Allyson Sabal Reason for Consultation   Syncope Requesting Physician  Dr. Mikey Bussing  HPI: Bruce Fleming is a 71 y.o. male with a history of alzheimer disease with behavioral disturbances, syncope fall 2017, HLd and hypothyroidism presents for syncope.  Patient in DNR. Later stage of Alzheimer disease. Intermittently required facility admission of aggressive behavior.No prior cardiac history. Never evaluated by cardiologist.  Patient had syncope yesterday while sitting. Wife was combing hair of patient and suddenly patient tilted his head back words, open eyes, pale, diaphoretic and LOC for about 3 minutes. Urinary incontinence. No shakiness, tongue biting or seizure-like activity. EMS was called. Per wife patient had a pulse. Unable to find EMS strip On his chart. Patient had a similar episode fall 2017.  In ER patient noted pulse of 47 with a blood pressure of 102/68. EKG shows sinus bradycardia at rate of 45 bpm. Chest x-ray unremarkable. Patient was admitted for observation. Unable to place on telemetry as patient was aggressive and taken off lead.  Nonsmoker. Nondrinker. Denies fevers, chills, nausea, vomiting, chest pain, shortness of breath, abdominal pain, palpitations, orthopnea or PND.   Past Medical History:  Diagnosis Date  . Alzheimer's dementia   . Hyperlipidemia   . Thyroid disease      History reviewed. No pertinent surgical history.  No Known Allergies  I have reviewed the patient's current medications . divalproex  250 mg Oral 2 times per day  . divalproex  500 mg Oral QHS  . donepezil  5 mg Oral QHS  . enoxaparin (LOVENOX) injection  40 mg Subcutaneous Q24H  . QUEtiapine  100 mg Oral TID  . sertraline  25 mg Oral Daily     Prior to Admission medications     Medication Sig Start Date End Date Taking? Authorizing Provider  divalproex (DEPAKOTE) 250 MG DR tablet Take 250 mg by mouth 2 (two) times daily. Takes at 0700, 1400   Yes Historical Provider, MD  divalproex (DEPAKOTE) 500 MG DR tablet Take 500 mg by mouth at bedtime.   Yes Historical Provider, MD  donepezil (ARICEPT) 5 MG tablet Take 5 mg by mouth at bedtime.   Yes Historical Provider, MD  QUEtiapine (SEROQUEL) 100 MG tablet Take 100 mg by mouth 3 (three) times daily.   Yes Historical Provider, MD  QUEtiapine (SEROQUEL) 50 MG tablet Take 50 mg by mouth at bedtime.   Yes Historical Provider, MD  sertraline (ZOLOFT) 25 MG tablet Take 25 mg by mouth daily.   Yes Historical Provider, MD     Social History   Social History  . Marital status: Married    Spouse name: N/A  . Number of children: N/A  . Years of education: N/A   Occupational History  . Not on file.   Social History Main Topics  . Smoking status: Former Smoker    Years: 10.00    Types: Cigarettes    Quit date: 06/16/1978  . Smokeless tobacco: Never Used  . Alcohol use Yes     Comment: SPECIAL OCCASSION  . Drug use: Unknown  . Sexual activity: Not on file   Other Topics Concern  . Not on file   Social History Narrative  . No narrative on file    Family Status  Relation Status  . Mother Deceased  .  Father Deceased  . Maternal Grandmother Deceased  . Maternal Grandfather Deceased  . Paternal Grandmother Deceased  . Paternal Grandfather Deceased   Family History  Problem Relation Age of Onset  . Alzheimer's disease Mother   . Alzheimer's disease Maternal Grandmother     ROS:  Full 14 point review of systems complete and found to be negative unless listed above.  Physical Exam: Blood pressure 121/89, pulse 94, temperature 98.4 F (36.9 C), temperature source Oral, resp. rate 16, SpO2 98 %.  General: Well developed, well nourished, male in no acute distress Head: Eyes PERRLA, No xanthomas. Normocephalic and  atraumatic, oropharynx without edema or exudate.  Lungs: Resp regular and unlabored, CTA. Heart: RRR no s3, s4, or murmurs..   Neck: No carotid bruits. No lymphadenopathy.  No JVD. Abdomen: Bowel sounds present, abdomen soft and non-tender without masses or hernias noted. Msk:  No spine or cva tenderness. No weakness, no joint deformities or effusions. Extremities: No clubbing, cyanosis or edema. DP/PT/Radials 2+ and equal bilaterally. Neuro: Alert and oriented X 3. No focal deficits noted. Psych:  Good affect, responds appropriately Skin: No rashes or lesions noted.  Labs:   Lab Results  Component Value Date   WBC 4.8 08/25/2016   HGB 12.8 (L) 08/25/2016   HCT 38.1 (L) 08/25/2016   MCV 88.0 08/25/2016   PLT 151 08/25/2016   No results for input(s): INR in the last 72 hours.  Recent Labs Lab 08/25/16 1358  NA 139  K 4.0  CL 107  CO2 24  BUN 17  CREATININE 0.92  CALCIUM 8.6*  PROT 6.2*  BILITOT 0.5  ALKPHOS 53  ALT 14*  AST 16  GLUCOSE 108*  ALBUMIN 3.4*   No results found for: MG  Recent Labs  08/25/16 1358  TROPONINI <0.03   No results for input(s): TROPIPOC in the last 72 hours. No results found for: PROBNP Lab Results  Component Value Date   CHOL 209 (H) 12/11/2014   HDL 61 12/11/2014   LDLCALC 111 (H) 12/11/2014   TRIG 186 (H) 12/11/2014   No results found for: DDIMER No results found for: LIPASE, AMYLASE TSH  Date/Time Value Ref Range Status  08/25/2016 06:52 PM 4.118 0.350 - 4.500 uIU/mL Final    Comment:    Performed by a 3rd Generation assay with a functional sensitivity of <=0.01 uIU/mL.  12/11/2014 03:48 PM 2.412 0.350 - 4.500 uIU/mL Final   Vitamin B-12  Date/Time Value Ref Range Status  03/18/2012 10:05 AM 364 211 - 911 pg/mL Final    Radiology:  Dg Chest Port 1 View  Result Date: 08/25/2016 CLINICAL DATA:  LOC for 2 minutes EXAM: PORTABLE CHEST 1 VIEW COMPARISON:  03/05/2016 FINDINGS: AP portable semi-erect view of the chest  demonstrates low lung volumes. No acute infiltrate or effusion. Borderline cardiomegaly, augmented by portable technique and low lung volume. No pneumothorax. IMPRESSION: 1. Low lung volumes 2. Borderline cardiomegaly, augmented by shallow inspiration 3. No acute infiltrate or edema. Electronically Signed   By: Jasmine PangKim  Fujinaga M.D.   On: 08/25/2016 15:39    ASSESSMENT AND PLAN:     1. Syncope - This is his second episode. First episode occurred in fall 2017. Per wife patient had pulse on EMS check. No strip for review. Noted sinus bradycardic at rate of 45 bpm. Not on telemetry as patient took of pads. His syncope related to bradycardia. Not on any AV blocking agent.  He is not a candidate for an event monitor  due to aggressive behavior. Questionable he is a candidate for loop recorder versus pacemaker. Unsure if he can tolerate the procedure or he may try to rip off device if required. First step is to get echocardiogram. He is DNR. Family is deciding wishes.   2. Symptomatic sinus bradycardia - As above.    SignedManson Passey, PA 08/26/2016, 3:11 PM Pager [phone]  Co-Sign MD  Agree with note by Annette Stable  We are asked to evaluate this 71 year old married Caucasian male with advanced Alzheimer / presenile dementia for syncope. He is on no rate slowing drugs. His EKG shows sinus bradycardia 45. He has had a syncopal episode 5 months ago which was witnessed. He lives with his wife. He is a DO NOT RESUSCITATE and does not really get out of the house much. His exam is benign. His laboratory exam is unremarkable. I had a frank discussion with the wife and daughter about goals of care. I do not think he is a candidate for pacemaker and therefore do not see the utility of performing diagnostic tests such as 2-D echo cardiogram or event monitoring. The family is in agreement. We will see again as needed.  Runell Gess, M.D., FACP, [facility], [facility]  [facility] [address] [place], [place]  [address]  [phone] 08/26/2016 4:29 PM

## 2016-08-26 NOTE — H&P (Signed)
Internal Medicine Attending Admission Note  I saw and evaluated the patient. I reviewed the resident's note and I agree with the resident's findings and plan as documented in the resident's note.  Assessment & Plan by Problem:   Loss of consciousness (HCC) 2/2 Symptomatic sinus bradycardia - He was noted to have sinus bradycardia with presenting EKG, this is likely the source of his syncopal episode with loss of consciousness. It does not appear he is on any medications or causes, we did discuss with his wife she apparently does not take any medications such as beta blockers or calcium channel blockers, but we're unable to review her full medication list to see if he may have ingested one of those medications. Overall he wants a limited scope of care. I think it was reasonable to rule out easily treated infection such as UTI however his UA was negative. He apparently does not want imaging such as chest x-ray however one was obtained before this information was noted in the ED and did not reveal any pulmonary infiltrate.  We have discussed this with his wife and daughter given his limited scope of care are not sure anything further needs or should be done however they are legitimately concerned about this happening again and would like a cardiology consult.  His PCP is Bruce Fleming now, and we will plan to discuss with Bruce Fleming cardiology's plan for further interventions to seek his advice before further interventions.    Alzheimer's dementia with behavioral disturbance Continue his home medications  Chief Complaint(s): LOC  History - key components related to admission: Bruce Fleming is a 71 year old male with a past medical history of also her stenoses with behavioral disturbances, hypothyroidism, hyperlipidemia who is in for evaluation of loss of consciousness. Family noted that he was in the kitchen and suddenly slumped over and lost consciousness without any preceding events.  He was  unresponsive for about 2 minutes however subsequently regained consciousness. He denies any recent new medications, he has not taken other family members medications. He has not had any recent illnesses and has not reported family any complaints. In the ED he was noted to be bradycardic. A chest x-ray was obtained which was clear, and EKG revealed sinus bradycardia. He has a most form which indicates he is DO NOT RESUSCITATE, does not wish for IV fluids, no imaging and only use of antibiotics by physician discretion. Apparently this most form did not initially arrive when the patient presented to the ED as as he received IV fluids, as well as chest x-ray.   Lab results: Reviewed in Epic EKG personally reviewed, sinus bradycardia normal axis and no ST or T-wave changes normal QTC   Physical Exam - key components related to admission: General: resting in bed, no distress, calm HEENT: EOMI, no scleral icterus Cardiac: RRR, no rubs, murmurs or gallops Pulm: clear to auscultation bilaterally, moving normal volumes of air Abd: soft, nontender, nondistended, BS present Ext: warm and well perfused, no pedal edema Neuro: alert and oriented X2  Vitals:   08/26/16 0438 08/26/16 0831 08/26/16 0832 08/26/16 0838  BP: 120/80 118/78 117/74 121/89  Pulse: 71 79 78 94  Resp: 18 16  16   Temp: 98.1 F (36.7 C) 98.4 F (36.9 C)    TempSrc: Oral Oral    SpO2: 98% 95% 96% 98%

## 2016-08-26 NOTE — Progress Notes (Signed)
NURSING PROGRESS NOTE  Philipp OvensKenneth Joseph Transue [id number] Discharge Data: 08/26/2016 5:06 PM Attending Provider: Gust RungErik C Hoffman, DO PCP:Pcp Not In System     Fabio PierceKenneth Joseph Demuro to be D/C'd Home with wife per MD order.  Discussed with the patient's wife and daughter the After Visit Summary and all questions fully answered. All IV's discontinued with no bleeding noted. All belongings returned to patient for patient to take home.   Last Vital Signs:  Blood pressure 121/89, pulse 94, temperature 98.4 F (36.9 C), temperature source Oral, resp. rate 16, SpO2 98 %.  Discharge Medication List Allergies as of 08/26/2016   No Known Allergies     Medication List    TAKE these medications   divalproex 250 MG DR tablet Commonly known as:  DEPAKOTE Take 250 mg by mouth 2 (two) times daily. Takes at 0700, 1400   divalproex 500 MG DR tablet Commonly known as:  DEPAKOTE Take 500 mg by mouth at bedtime.   donepezil 5 MG tablet Commonly known as:  ARICEPT Take 5 mg by mouth at bedtime.   QUEtiapine 100 MG tablet Commonly known as:  SEROQUEL Take 100 mg by mouth 3 (three) times daily.   QUEtiapine 50 MG tablet Commonly known as:  SEROQUEL Take 50 mg by mouth at bedtime.   sertraline 25 MG tablet Commonly known as:  ZOLOFT Take 25 mg by mouth daily.

## 2016-08-26 NOTE — Progress Notes (Signed)
Patient is removing his leads for cardiac monitor and constantly trying to get out of bed despite being redirected.  Patient has a very unsteady gait and prior to admission had a syncope episode.  Notified the on call MD from internal medicine Dr. Nelson ChimesAmin.  MD ordered Haldol.  Will continue to monitor the patient and notify as needed

## 2016-08-26 NOTE — Progress Notes (Signed)
   Subjective: Bruce Fleming was seen and evaluated today at bedside. No complaints overnight and reports he feels well. He denies any chest pain or shortness of breath. No more syncopal events overnight. Feels ready for discharge.   Objective:  Vital signs in last 24 hours: Vitals:   08/26/16 0438 08/26/16 0831 08/26/16 0832 08/26/16 0838  BP: 120/80 118/78 117/74 121/89  Pulse: 71 79 78 94  Resp: 18 16  16   Temp: 98.1 F (36.7 C) 98.4 F (36.9 C)    TempSrc: Oral Oral    SpO2: 98% 95% 96% 98%   General: Well developed and well nourished caucasian male. In no acute distress. Resting comfortably in bed.  Cardiovascular: Regular rate and rhythm. No murmur or rub appreciated. Pulmonary: CTA BL, no wheezing, crackles or rhonchi appreciated. Unlabored breathing. Good air movement Abdomen: Soft, non-tender and non-distended. No guarding or rigidity. +bowel sounds.  Extremities: No peripheral edema noted BL. Intact distal pulses.  Skin: Warm, dry. No cyanosis.  Psych: Very pleasant and open for discussion. Mood normal and affect was mood congruent. Responds to questions appropriately.   Assessment/Plan:  Active Problems:   Alzheimer's dementia with behavioral disturbance   Loss of consciousness (HCC)   Symptomatic sinus bradycardia  Symptomatic Sinus Bradycardia, LOC: UA and CXR without infection. CBC without anemia or leukocytosis to suggest underlying infection. He has remained HD stable overnight without bradycardia. This seems to have resolved in the ED after reciving IVF. Could be a degree of autonomic dysfunction in setting of his progressive neurological disease. Discussed with patients wife today on phone. She echos that the patient would not desire pacemaker placement as this would require a surgical procedure, imaging and several other interventions. Informed that events such as this may occur again in the future and that the treatment for symptomatic bradycardia is pacemaker  placement however still not interested in pacer. She reports the patient is currently being seen by palliative care and she desires hospice care. Due to patients otherwise essentially healthy status, he does not appear to qualify for hospice at this time. I encouraged continued work with palliative care.  -TSH 4.1 -UA and CXR without signs of infection -believe LOC due to sinus brady.  -Could be some degree of autonomic dysfunction however patient and family not interested in invasive procedures such as pacer or even medications that could prolong life. -Will be discharged home to continue working with palliative care.   Alzheimer dementia with behavioral disturbances: On Donepezil, depakote and zoloft. Discussed patients disease progression with wife, Bruce Fleming, on the phone. She reports pt developed early onset AD at age 71. 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 Went through phase of aggression however quite pleasant now on current medication. Sees palliative care and his PCP as outpatient regularly. She expresses sadness over her husbands changes in mental status throughout the years. Husband currently able to perform all ADLs independently however is not particularly active at home and spends his time watching TV mostly. -Continue current therapy as above -Pt to follow up with palliative care upon d/c  Dispo: Anticipated discharge today.  Bruce Karpowicz, DO 08/26/2016, 11:06 AM Pager: [phone]

## 2016-08-26 NOTE — Care Management Note (Signed)
Case Management Note  Patient Details  Name: Bruce Fleming MRN: [id number] Date of Birth: 10-25-1945  Subjective/Objective:     Presented with   Loss of consciousness 2/2 Symptomatic sinus bradycardia, hx of Alzheimer's dementia with behavioral disturbance.  From home with wife and daughter.              Nelia ShiLinda Biermann (Spouse)     [phone]      PCP:    Action/Plan: Plan is to d/c to home with the [facility] .  Expected Discharge Date:  08/26/16               Expected Discharge Plan:  Home/Self Care (Resides with wife/ daughter)  In-House Referral:     Discharge planning Services  CM Consult  Post Acute Care Choice:  Resumption of Svcs/PTA Provider/ palliative care/ [facility] Choice offered to:     DME Arranged:    DME Agency:     HH Arranged:    HH Agency:     Status of Service:  Completed, signed off  If discussed at [employer] Meetings, dates discussed:    Additional Comments:  Epifanio LeschesCole, Carnesha Maravilla Hudson, RN 08/26/2016, 4:16 PM

## 2016-09-04 NOTE — Discharge Summary (Signed)
Name: Bruce Fleming MRN: [id number] DOB: July 23, 1945 71 y.o. PCP: Pcp Not In System  Date of Admission: 08/25/2016  1:41 PM Date of Discharge: 08/26/2016 Attending Physician: Gust RungErik C Hoffman, D.O.   Discharge Diagnosis: 1. Loss of consciousness secondary to symptomatic sinus bradycardia  Principal Problem:   Loss of consciousness (HCC) Active Problems:   Alzheimer's dementia with behavioral disturbance   Symptomatic sinus bradycardia  Discharge Medications: Allergies as of 08/26/2016   No Known Allergies     Medication List    TAKE these medications   divalproex 250 MG DR tablet Commonly known as:  DEPAKOTE Take 250 mg by mouth 2 (two) times daily. Takes at 0700, 1400   divalproex 500 MG DR tablet Commonly known as:  DEPAKOTE Take 500 mg by mouth at bedtime.   donepezil 5 MG tablet Commonly known as:  ARICEPT Take 5 mg by mouth at bedtime.   QUEtiapine 100 MG tablet Commonly known as:  SEROQUEL Take 100 mg by mouth 3 (three) times daily.   QUEtiapine 50 MG tablet Commonly known as:  SEROQUEL Take 50 mg by mouth at bedtime.   sertraline 25 MG tablet Commonly known as:  ZOLOFT Take 25 mg by mouth daily.       Disposition and follow-up:   Bruce Fleming [facility] condition.  At the hospital follow up visit please address:  1.  Bradycardia, LOC: Please evaluate for any more episodes of LOC and also please ensure patient does not have access to any rate lowering medications. DNR with limited scope of care: Will need to meet again with palliative care in the outpatient setting to discuss goals of care  2.  Labs / imaging needed at time of follow-up: none.  3.  Pending labs/ test needing follow-up: none.  Follow-up Appointments: Follow-up Information    Bruce PernaAnita Colleen Coady, NP. Schedule an appointment as soon as possible for a visit in 1 week(s).   Specialty:  Hospice and Palliative  Medicine Contact information: [address] [place] [address] [phone]        [facility].   Specialty:  Hospice and Palliative Medicine Why:  [facility] to resume palliative care, office to call and setup home visit Contact information: [address] [place] [address] [phone]           Hospital Course by problem list: Principal Problem:   Loss of consciousness (HCC) Active Problems:   Alzheimer's dementia with behavioral disturbance   Symptomatic sinus bradycardia   1. loss of consciousness secondary to symptomatic sinus bradycardia Bruce Fleming is a 71 year old male with Alzheimer's disease, hypothyroidism and hyperlipidemia who presents for evaluation of loss of consciousness. He is DNR with care limited by no imaging including no x-rays, no IV fluids, no surgery and antibiotics at the discretion of the provider. Patient was reportedly seated when he blacked out for about 2 minutes. This happened day prior to admission as well. Family denied any sort of seizure-like activity and the patient denied any infectious symptoms including fever, chills, nausea, vomiting abdominal pain or any other symptoms. On presentation he was hypothermic and bradycardic. He is on no rate slowing medications. Chest x-ray without infiltration and UA was negative for infection. Telemetry was without arrhythmia other than initial sinus bradycardia. He was seen and evaluated by cardiology who had frank discussion with patient and his wife regarding pacemaker placement however was  not felt to be candidate and also was not felt to be aligned with patient's advanced directives. At time of discharge he was in normal sinus rhythm with a rate of 80-90.  Discharge Vitals:   BP 121/89 (BP Location: Right Arm)   Pulse 94   Temp 98.4 F (36.9 C) (Oral)   Resp 16   SpO2 98%   Pertinent Labs, Studies, and Procedures:  CMET:  Albumin 3.4, otherwise within normal limits CBC: Hemoglobin 12.8, otherwise within normal limits TSH 4.1 UA: Negative for bacteria, white blood cells, leukocytes, nitrite Chest x-ray: Without acute cardiopulmonary process, specifically without infiltrate EKG: Sinus bradycardia rate 45 without ST segment changes  Signed: Ygnacio Fecteau, DO 09/04/2016, 1:16 PM   Pager: [phone]

## 2017-02-02 ENCOUNTER — Emergency Department (HOSPITAL_COMMUNITY)
Admission: EM | Admit: 2017-02-02 | Discharge: 2017-02-03 | Disposition: A | Discharge status: Home / Self Care | Payer: Medicare Other | Attending: Emergency Medicine | Admitting: Emergency Medicine

## 2017-02-02 DIAGNOSIS — F0280 Dementia in other diseases classified elsewhere without behavioral disturbance: Secondary | ICD-10-CM | POA: Insufficient documentation

## 2017-02-02 DIAGNOSIS — W19XXXA Unspecified fall, initial encounter: Secondary | ICD-10-CM | POA: Diagnosis not present

## 2017-02-02 DIAGNOSIS — G309 Alzheimer's disease, unspecified: Secondary | ICD-10-CM | POA: Diagnosis not present

## 2017-02-02 DIAGNOSIS — E039 Hypothyroidism, unspecified: Secondary | ICD-10-CM | POA: Diagnosis not present

## 2017-02-02 DIAGNOSIS — Y939 Activity, unspecified: Secondary | ICD-10-CM | POA: Diagnosis not present

## 2017-02-02 DIAGNOSIS — Z79899 Other long term (current) drug therapy: Secondary | ICD-10-CM | POA: Insufficient documentation

## 2017-02-02 DIAGNOSIS — Z87891 Personal history of nicotine dependence: Secondary | ICD-10-CM | POA: Insufficient documentation

## 2017-02-02 DIAGNOSIS — Y999 Unspecified external cause status: Secondary | ICD-10-CM | POA: Diagnosis not present

## 2017-02-02 DIAGNOSIS — S0181XA Laceration without foreign body of other part of head, initial encounter: Secondary | ICD-10-CM | POA: Diagnosis not present

## 2017-02-02 DIAGNOSIS — Y929 Unspecified place or not applicable: Secondary | ICD-10-CM | POA: Insufficient documentation

## 2017-02-02 NOTE — ED Notes (Signed)
Bed: TU88 Expected date:  Expected time:  Means of arrival:  Comments: 71 yo M/Fall

## 2017-02-02 NOTE — ED Triage Notes (Signed)
Per EMS pt presents from [facility] for evaluation of unwitnessed fall from bed to floor appx 2 feet. Pt has hx of dementia and uncertain of what occurred. EMS advised abrasion to left eye brow that was dressed prior to arrival. C-collar applied by EMS prior to arrival.

## 2017-02-03 ENCOUNTER — Encounter (HOSPITAL_COMMUNITY): Payer: Self-pay | Admitting: Emergency Medicine

## 2017-02-03 NOTE — ED Notes (Signed)
Attempted to call [facility], no answer.  Will try again.

## 2017-02-03 NOTE — ED Notes (Signed)
Attempted to call [facility] to [facility].  No one answered.

## 2017-02-03 NOTE — Discharge Instructions (Signed)
You were seen today for fall. Your EKG is reassuring. Laceration was repaired with skin glue. Keep covered.

## 2017-02-03 NOTE — ED Provider Notes (Signed)
..  Laceration Repair Date/Time: 02/03/2017 1:54 AM Performed by: Dierdre Forth Authorized by: Dierdre Forth   Consent:    Consent obtained:  Verbal   Consent given by:  Spouse   Risks discussed:  Infection, pain, poor cosmetic result and poor wound healing   Alternatives discussed:  No treatment Anesthesia (see MAR for exact dosages):    Anesthesia method:  None Laceration details:    Location:  Face   Face location:  Forehead   Length (cm):  3 Repair type:    Repair type:  Simple Pre-procedure details:    Preparation:  Patient was prepped and draped in usual sterile fashion Exploration:    Hemostasis achieved with:  Direct pressure   Wound exploration: entire depth of wound probed and visualized   Treatment:    Area cleansed with:  Saline   Amount of cleaning:  Standard   Irrigation solution:  Sterile water   Irrigation method:  Syringe Skin repair:    Repair method:  Tissue adhesive Approximation:    Approximation:  Close   Vermilion border: well-aligned   Post-procedure details:    Dressing:  Open (no dressing)   Patient tolerance of procedure:  Tolerated well, no immediate complications     Milta Deiters 02/03/17 0155    Wilkie Aye Mayer Masker, MD 02/08/17 [phone]

## 2017-02-03 NOTE — ED Notes (Signed)
Dermabond applied by PA Cyndia Skeeters.

## 2017-02-03 NOTE — ED Provider Notes (Signed)
[facility]-EMERGENCY DEPT Provider Note   CSN: [id number] Arrival date & time: 02/02/17  2352     History   Chief Complaint Chief Complaint  Patient presents with  . Fall    HPI Bruce Fleming is a 71 y.o. male.  HPI  This is a 71 year old male with a history of Alzheimer's dementia who presents following a fall. Patient sustained an unwitnessed fall. He has a small laceration to the 4 head. History of dementia and only oriented to himself. Per the family this is his baseline. Family states that he is DO NOT RESUSCITATE. He is also followed closely by hospice. Patient denies any pain. Limited history secondary to dementia.  Level V caveat for dementia  Past Medical History:  Diagnosis Date  . Alzheimer's dementia   . Hyperlipidemia   . Thyroid disease     Patient Active Problem List   Diagnosis Date Noted  . Symptomatic sinus bradycardia 08/26/2016  . Loss of consciousness (HCC) 08/25/2016  . Alzheimer's dementia with behavioral disturbance 03/06/2016  . Hypothyroid 04/07/2013  . Hyperlipidemia 04/07/2013  . Memory loss, short term 04/07/2013    History reviewed. No pertinent surgical history.     Home Medications    Prior to Admission medications   Medication Sig Start Date End Date Taking? Authorizing Provider  divalproex (DEPAKOTE) 250 MG DR tablet Take 250 mg by mouth 2 (two) times daily. Takes at 0700, 1400    [provider]  divalproex (DEPAKOTE) 500 MG DR tablet Take 500 mg by mouth at bedtime.    [provider]  donepezil (ARICEPT) 5 MG tablet Take 5 mg by mouth at bedtime.    [provider]  QUEtiapine (SEROQUEL) 100 MG tablet Take 100 mg by mouth 3 (three) times daily.    [provider]  QUEtiapine (SEROQUEL) 50 MG tablet Take 50 mg by mouth at bedtime.    [provider]  sertraline (ZOLOFT) 25 MG tablet Take 25 mg by mouth daily.    [provider]    Family History Family History    Problem Relation Age of Onset  . Alzheimer's disease Mother   . Alzheimer's disease Maternal Grandmother     Social History Social History  Substance Use Topics  . Smoking status: Former Smoker    Years: 10.00    Types: Cigarettes    Quit date: 06/16/1978  . Smokeless tobacco: Never Used  . Alcohol use Yes     Comment: SPECIAL OCCASSION     Allergies   Patient has no known allergies.   Review of Systems Review of Systems  Unable to perform ROS: Dementia  All other systems reviewed and are negative.    Physical Exam Updated Vital Signs BP 123/79 (BP Location: Left Arm)   Pulse 80   Temp 98.6 F (37 C) (Oral)   Resp 18   Ht 6\' 2"  (1.88 m)   Wt 111.1 kg (245 lb)   SpO2 97%   BMI 31.46 kg/m   Physical Exam  Constitutional: He appears well-developed and well-nourished. No distress.  HENT:  Head: Normocephalic.  1 0.5 cm vertical laceration between the eyebrows, adjacent 1 cm laceration, bleeding controlled, no hematoma, mild bruising noted to the bridge of the nose  Eyes: Pupils are equal, round, and reactive to light. EOM are normal.  Neck: Normal range of motion.  Cardiovascular: Normal rate, regular rhythm and normal heart sounds.   No murmur heard. Pulmonary/Chest: Effort normal and breath sounds normal. No  respiratory distress. He has no wheezes.  Abdominal: Soft. Bowel sounds are normal. There is no tenderness. There is no rebound.  Musculoskeletal: Normal range of motion. He exhibits edema.  Normal range of motion of bilateral hips and knees with no obvious deformities  Neurological: He is alert.  Oriented 1, cranial nerves II through XII intact, follow simple commands  Skin: Skin is warm and dry.  Psychiatric: He has a normal mood and affect.  Nursing note and vitals reviewed.    ED Treatments / Results  Labs (all labs ordered are listed, but only abnormal results are displayed) Labs Reviewed - No data to display  EKG  EKG  Interpretation  Date/Time:  Tuesday February 03 2017 00:00:20 EDT Ventricular Rate:  74 PR Interval:    QRS Duration: 92 QT Interval:  394 QTC Calculation: 438 R Axis:   51 Text Interpretation:  Sinus rhythm Ventricular premature complex Confirmed by Ross Marcus ([id number]) on 02/03/2017 1:23:52 AM       Radiology No results found.  Procedures Procedures (including critical care time)  Medications Ordered in ED Medications - No data to display   Initial Impression / Assessment and Plan / ED Course  I have reviewed the triage vital signs and the nursing notes.  Pertinent labs & imaging results that were available during my care of the patient were reviewed by me and considered in my medical decision making (see chart for details).     Patient presents following a fall. He is nontoxic-appearing. Details regarding fall unclear at this time. He did sustain a small injury to the 4 head. I discussed at length options with the family given his goals of care. The would like to minimize workup as they do not feel that they would him to undergo any significant procedures. I feel this is reasonable. Will forego CT scanning for this reason. Family is aware and okay with this plan. EKG was obtained given history of bradycardia. No arrhythmia noted. Laceration repaired by H. Muthersbaugh, PA.  After history, exam, and medical workup I feel the patient has been appropriately medically screened and is safe for discharge home. Pertinent diagnoses were discussed with the patient. Patient was given return precautions.   Final Clinical Impressions(s) / ED Diagnoses   Final diagnoses:  Fall, initial encounter  Facial laceration, initial encounter    New Prescriptions New Prescriptions   No medications on file     Shon Baton, MD 02/03/17 [phone]

## 2017-02-20 ENCOUNTER — Emergency Department (HOSPITAL_COMMUNITY)
Admission: EM | Admit: 2017-02-20 | Discharge: 2017-02-20 | Disposition: A | Discharge status: Home / Self Care | Payer: Medicare Other | Attending: Emergency Medicine | Admitting: Emergency Medicine

## 2017-02-20 DIAGNOSIS — Z87891 Personal history of nicotine dependence: Secondary | ICD-10-CM | POA: Diagnosis not present

## 2017-02-20 DIAGNOSIS — Z79899 Other long term (current) drug therapy: Secondary | ICD-10-CM | POA: Diagnosis not present

## 2017-02-20 DIAGNOSIS — Y939 Activity, unspecified: Secondary | ICD-10-CM | POA: Insufficient documentation

## 2017-02-20 DIAGNOSIS — Y999 Unspecified external cause status: Secondary | ICD-10-CM | POA: Diagnosis not present

## 2017-02-20 DIAGNOSIS — G309 Alzheimer's disease, unspecified: Secondary | ICD-10-CM | POA: Diagnosis not present

## 2017-02-20 DIAGNOSIS — E039 Hypothyroidism, unspecified: Secondary | ICD-10-CM | POA: Diagnosis not present

## 2017-02-20 DIAGNOSIS — W19XXXA Unspecified fall, initial encounter: Secondary | ICD-10-CM | POA: Insufficient documentation

## 2017-02-20 DIAGNOSIS — F0281 Dementia in other diseases classified elsewhere with behavioral disturbance: Secondary | ICD-10-CM | POA: Insufficient documentation

## 2017-02-20 DIAGNOSIS — S0091XA Abrasion of unspecified part of head, initial encounter: Secondary | ICD-10-CM | POA: Diagnosis present

## 2017-02-20 DIAGNOSIS — Y929 Unspecified place or not applicable: Secondary | ICD-10-CM | POA: Diagnosis not present

## 2017-02-20 DIAGNOSIS — S0003XA Contusion of scalp, initial encounter: Secondary | ICD-10-CM | POA: Diagnosis not present

## 2017-02-20 NOTE — ED Triage Notes (Signed)
Per [facility] pt [facility] from [facility] w/ hx dementia that arrived at baseline according to staff (CAOx1 to self per norm). Pt was found ambulating in his room with hematoma on the rt side on his forehead with an abrasion and also an abrasion on the top of his head. Unwitnessed incident. Pt not on blood thinners. 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

## 2017-02-20 NOTE — ED Notes (Signed)
Pt unable to provide signature for discharge instructions. [facility] Caregiver verbalized understanding and denied further questions or comments.

## 2017-02-20 NOTE — ED Provider Notes (Signed)
[facility]-EMERGENCY DEPT Provider Note   CSN: [id number] Arrival date & time: 02/20/17  0907     History   Chief Complaint Chief Complaint  Patient presents with  . Head abrasion    HPI Bruce Fleming is a 71 y.o. male.  The history is provided by the EMS personnel and the spouse. No language interpreter was used.    Bruce Fleming is a 71 y.o. male who presents to the Emergency Department complaining of head injury.  Level V caveat due to dementia. Per EMS reports patient was found to have a hematoma to the side of his head. He has advanced Alzheimer's and does not recall any injuries. He has no complaints and is at his baseline. His wife states that he is on hospice for advanced Alzheimer's. She states that he would not want any workup or treatment for any acute injuries or illnesses.  Past Medical History:  Diagnosis Date  . Alzheimer's dementia   . Hyperlipidemia   . Thyroid disease     Patient Active Problem List   Diagnosis Date Noted  . Symptomatic sinus bradycardia 08/26/2016  . Loss of consciousness (HCC) 08/25/2016  . Alzheimer's dementia with behavioral disturbance 03/06/2016  . Hypothyroid 04/07/2013  . Hyperlipidemia 04/07/2013  . Memory loss, short term 04/07/2013    No past surgical history on file.     Home Medications    Prior to Admission medications   Medication Sig Start Date End Date Taking? Authorizing Provider  divalproex (DEPAKOTE) 250 MG DR tablet Take 250 mg by mouth 2 (two) times daily. Takes at 0700, 1400   Yes [provider]  divalproex (DEPAKOTE) 500 MG DR tablet Take 500 mg by mouth at bedtime.   Yes [provider]  QUEtiapine (SEROQUEL) 100 MG tablet Take 100 mg by mouth 4 (four) times daily.    Yes [provider]  sertraline (ZOLOFT) 25 MG tablet Take 25 mg by mouth daily.   Yes [provider]  triamcinolone cream (KENALOG) 0.1 % Apply 1 application topically 2 (two) times daily.   Yes  [provider]    Family History Family History  Problem Relation Age of Onset  . Alzheimer's disease Mother   . Alzheimer's disease Maternal Grandmother     Social History Social History  Substance Use Topics  . Smoking status: Former Smoker    Years: 10.00    Types: Cigarettes    Quit date: 06/16/1978  . Smokeless tobacco: Never Used  . Alcohol use Yes     Comment: SPECIAL OCCASSION     Allergies   Patient has no known allergies.   Review of Systems Review of Systems  Unable to perform ROS: Dementia     Physical Exam Updated Vital Signs BP 131/76 (BP Location: Right Arm)   Pulse 62   Temp 97.6 F (36.4 C) (Oral)   Resp (!) 24   SpO2 100%   Physical Exam  Constitutional: He appears well-developed and well-nourished. No distress.  HENT:  Head: Normocephalic.  Swelling and hemostatic abrasion noted to the left parietal scalp  Eyes: Pupils are equal, round, and reactive to light.  Neck:  No cervical tenderness to palpation  Cardiovascular: Normal rate.   Faint systolic ejection murmur  Pulmonary/Chest: Effort normal and breath sounds normal. No respiratory distress.  Abdominal: Soft. There is no tenderness. There is no guarding.  Musculoskeletal:  Trace edema to the right lower extremity. No hip tenderness to palpation. No pain in the  hips with passive range of motion.  Neurological: He is alert.  Disoriented to place and time. Moves all extremities strongly and symmetrically. Speech is difficult to understand and mumbling at times. Difficulty following simple commands.  Skin: Skin is warm and dry. Capillary refill takes less than 2 seconds.  Psychiatric:  Unable to assess  Nursing note and vitals reviewed.    ED Treatments / Results  Labs (all labs ordered are listed, but only abnormal results are displayed) Labs Reviewed - No data to display  EKG  EKG Interpretation None       Radiology No results found.  Procedures Procedures  (including critical care time)  Medications Ordered in ED Medications - No data to display   Initial Impression / Assessment and Plan / ED Course  I have reviewed the triage vital signs and the nursing notes.  Pertinent labs & imaging results that were available during my care of the patient were reviewed by me and considered in my medical decision making (see chart for details).    Pt w/ hx/o advanced alzheimer's here for evaluation of head swelling - unwitnessed injury.  Wife and daughter were in the Emergency Department and state that he is a hospice patient and comfort care only - labs and imaging were declined.  Pt appears comfortable and per family is at his baseline.  Discussed case with his current hospice service - plan to d/c back to [facility] for ongoing care.  Patient's wife requested euthanasia due to his dementia and states that he would not like to live like this.  Expressed to his wife that it is not legal in this state and he does not appear to be currently in pain or uncomfortable.  Discussed with patient's primary hospice team wife's requests.   Final Clinical Impressions(s) / ED Diagnoses   Final diagnoses:  Contusion of scalp, initial encounter    New Prescriptions Discharge Medication List as of 02/20/2017 10:56 AM       Tilden Fossa, MD 02/20/17 1726

## 2017-02-20 NOTE — Discharge Instructions (Signed)
No images or labs were performed in the Emergency Department today at family's request. You may provide local wound care to the scalp abrasions.  Please contact Hospice for follow up.

## 2017-02-20 NOTE — ED Notes (Signed)
Bed: WA17 Expected date:  Expected time:  Means of arrival:  Comments: EMS- 71s, hematoma to head/Hx of dementia

## 2017-03-18 ENCOUNTER — Encounter (HOSPITAL_COMMUNITY): Payer: Self-pay

## 2017-03-18 ENCOUNTER — Emergency Department (HOSPITAL_COMMUNITY): Payer: Medicare Other

## 2017-03-18 ENCOUNTER — Emergency Department (HOSPITAL_COMMUNITY)
Admission: EM | Admit: 2017-03-18 | Discharge: 2017-03-18 | Disposition: A | Discharge status: Home / Self Care | Payer: Medicare Other | Attending: Emergency Medicine | Admitting: Emergency Medicine

## 2017-03-18 DIAGNOSIS — W19XXXA Unspecified fall, initial encounter: Secondary | ICD-10-CM | POA: Diagnosis not present

## 2017-03-18 DIAGNOSIS — Z87891 Personal history of nicotine dependence: Secondary | ICD-10-CM | POA: Diagnosis not present

## 2017-03-18 DIAGNOSIS — Z79899 Other long term (current) drug therapy: Secondary | ICD-10-CM | POA: Insufficient documentation

## 2017-03-18 DIAGNOSIS — E039 Hypothyroidism, unspecified: Secondary | ICD-10-CM | POA: Insufficient documentation

## 2017-03-18 DIAGNOSIS — Y92129 Unspecified place in nursing home as the place of occurrence of the external cause: Secondary | ICD-10-CM | POA: Insufficient documentation

## 2017-03-18 DIAGNOSIS — Y999 Unspecified external cause status: Secondary | ICD-10-CM | POA: Insufficient documentation

## 2017-03-18 DIAGNOSIS — Y939 Activity, unspecified: Secondary | ICD-10-CM | POA: Insufficient documentation

## 2017-03-18 DIAGNOSIS — F0390 Unspecified dementia without behavioral disturbance: Secondary | ICD-10-CM | POA: Insufficient documentation

## 2017-03-18 DIAGNOSIS — S0990XA Unspecified injury of head, initial encounter: Secondary | ICD-10-CM | POA: Diagnosis present

## 2017-03-18 DIAGNOSIS — S0181XA Laceration without foreign body of other part of head, initial encounter: Secondary | ICD-10-CM | POA: Insufficient documentation

## 2017-03-18 LAB — CBC
HEMATOCRIT: 31.3 % — AB (ref 39.0–52.0)
Hemoglobin: 10.7 g/dL — ABNORMAL LOW (ref 13.0–17.0)
MCH: 30.7 pg (ref 26.0–34.0)
MCHC: 34.2 g/dL (ref 30.0–36.0)
MCV: 89.9 fL (ref 78.0–100.0)
PLATELETS: 149 10*3/uL — AB (ref 150–400)
RBC: 3.48 MIL/uL — ABNORMAL LOW (ref 4.22–5.81)
RDW: 13.3 % (ref 11.5–15.5)
WBC: 3.4 10*3/uL — ABNORMAL LOW (ref 4.0–10.5)

## 2017-03-18 LAB — BASIC METABOLIC PANEL
Anion gap: 9 (ref 5–15)
BUN: 17 mg/dL (ref 6–20)
CHLORIDE: 104 mmol/L (ref 101–111)
CO2: 26 mmol/L (ref 22–32)
CREATININE: 0.88 mg/dL (ref 0.61–1.24)
Calcium: 8.9 mg/dL (ref 8.9–10.3)
GFR calc Af Amer: >60 mL/min (ref >60)
GFR calc non Af Amer: >60 mL/min (ref >60)
GLUCOSE: 107 mg/dL — AB (ref 65–99)
POTASSIUM: 3.4 mmol/L — AB (ref 3.5–5.1)
SODIUM: 139 mmol/L (ref 135–145)

## 2017-03-18 MED ORDER — LIDOCAINE-EPINEPHRINE (PF) 2 %-1:200000 IJ SOLN
10.0000 mL | Freq: Once | INTRAMUSCULAR | Status: AC
  Administered 2017-03-18: 10 mL
  Filled 2017-03-18: qty 20

## 2017-03-18 NOTE — ED Notes (Signed)
Patient transported to CT 

## 2017-03-18 NOTE — ED Triage Notes (Signed)
Pt arrives via EMS from [facility]; pt had unwitnessed fall and  Found in another residents room; pt has hx of dementia and resides on memory care area; pt is at baseline per EMS from [facility] staff; Pt is calm and does not grimace to touch; no family present on arrival. Pt has lac to forehead and arrives in C-collar; Pt has MOST form and DNR at bedside. Pt normally ambulates around unit independently per EMS.

## 2017-03-18 NOTE — Discharge Instructions (Signed)
Please have the sutures removed in 1 week.

## 2017-03-18 NOTE — ED Notes (Signed)
ED Provider at bedside. 

## 2017-03-18 NOTE — ED Provider Notes (Signed)
[facility]-EMERGENCY DEPT Provider Note   CSN: [id number] Arrival date & time:        History   Chief Complaint Chief Complaint  Patient presents with  . Fall    HPI Bruce Fleming is a 71 y.o. male.  HPI Level V caveat for dementia. Patient comes from nursing home after an unwitnessed fall and resultant laceration to the forehead. Patient unable to provide any meaningful history. Patient denies any pain.  Patient is not on any blood thinners. Family came in later on to see patient and they are unaware as to what led to the fall as well. Patient does have history of frequent falls in the past.  Past Medical History:  Diagnosis Date  . Alzheimer's dementia   . Hyperlipidemia   . Thyroid disease     Patient Active Problem List   Diagnosis Date Noted  . Symptomatic sinus bradycardia 08/26/2016  . Loss of consciousness (HCC) 08/25/2016  . Alzheimer's dementia with behavioral disturbance 03/06/2016  . Hypothyroid 04/07/2013  . Hyperlipidemia 04/07/2013  . Memory loss, short term 04/07/2013    History reviewed. No pertinent surgical history.     Home Medications    Prior to Admission medications   Medication Sig Start Date End Date Taking? Authorizing Provider  divalproex (DEPAKOTE) 250 MG DR tablet Take 250 mg by mouth 2 (two) times daily. Takes at 0700, 1400    [provider]  divalproex (DEPAKOTE) 500 MG DR tablet Take 500 mg by mouth at bedtime.    [provider]  QUEtiapine (SEROQUEL) 100 MG tablet Take 100 mg by mouth 4 (four) times daily.     [provider]  sertraline (ZOLOFT) 25 MG tablet Take 25 mg by mouth daily.    [provider]  triamcinolone cream (KENALOG) 0.1 % Apply 1 application topically 2 (two) times daily.    [provider]    Family History Family History  Problem Relation Age of Onset  . Alzheimer's disease Mother   . Alzheimer's disease Maternal Grandmother     Social History Social  History  Substance Use Topics  . Smoking status: Former Smoker    Years: 10.00    Types: Cigarettes    Quit date: 06/16/1978  . Smokeless tobacco: Never Used  . Alcohol use Yes     Comment: SPECIAL OCCASSION     Allergies   Patient has no known allergies.   Review of Systems Review of Systems  Unable to perform ROS: Dementia     Physical Exam Updated Vital Signs BP 107/67   Pulse (!) 56   Temp (!) 97.5 F (36.4 C) (Oral)   Resp 12   SpO2 100%   Physical Exam  Constitutional: He appears well-developed.  HENT:  4 cm laceration to the forehead  Eyes: EOM are normal.  Neck:  No midline c-spine tenderness, pt able to turn head to 45 degrees bilaterally without any pain and able to flex neck to the chest and extend without any pain or neurologic symptoms.  Cardiovascular: Normal rate.   Pulmonary/Chest: Effort normal.  Neurological: He is alert.  Skin: Skin is warm.  Nursing note and vitals reviewed.    ED Treatments / Results  Labs (all labs ordered are listed, but only abnormal results are displayed) Labs Reviewed  BASIC METABOLIC PANEL - Abnormal; Notable for the following:       Result Value   Potassium 3.4 (*)    Glucose, Bld 107 (*)  All other components within normal limits  CBC - Abnormal; Notable for the following:    WBC 3.4 (*)    RBC 3.48 (*)    Hemoglobin 10.7 (*)    HCT 31.3 (*)    Platelets 149 (*)    All other components within normal limits  URINALYSIS, ROUTINE W REFLEX MICROSCOPIC    EKG  EKG Interpretation  Date/Time:  Wednesday March 18 2017 04:38:47 EDT Ventricular Rate:  64 PR Interval:    QRS Duration: 91 QT Interval:  392 QTC Calculation: 405 R Axis:   92 Text Interpretation:  Sinus rhythm Right axis deviation No acute changes No significant change since last tracing Confirmed by Derwood Kaplan ([id number]) on 03/18/2017 4:49:28 AM       Radiology Ct Head Wo Contrast  Result Date: 03/18/2017 CLINICAL DATA:  Unwitnessed  fall at care facility. Forehead laceration. History of Alzheimer's disease. EXAM: CT HEAD WITHOUT CONTRAST CT CERVICAL SPINE WITHOUT CONTRAST TECHNIQUE: Multidetector CT imaging of the head and cervical spine was performed following the standard protocol without intravenous contrast. Multiplanar CT image reconstructions of the cervical spine were also generated. COMPARISON:  MRI of the head February 13, 2014 FINDINGS: CT HEAD FINDINGS BRAIN: No intraparenchymal hemorrhage, mass effect nor midline shift. Moderate ventriculomegaly with disproportionate temporoparietal atrophy. Patchy to confluent supratentorial white matter hypodensities. No acute large vascular territory infarcts. No abnormal extra-axial fluid collections. VASCULAR: Moderate calcific atherosclerosis of the carotid siphons. SKULL: No skull fracture. Small RIGHT frontal scalp hematoma with subcutaneous gas. No radiopaque foreign bodies. SINUSES/ORBITS: Tiny LEFT posterior ethmoid mucosal retention cysts, mild paranasal sinus mucosal thickening. Mastoid air cells are well aerated.The included ocular globes and orbital contents are non-suspicious. OTHER: None. CT CERVICAL SPINE FINDINGS ALIGNMENT: Straightened lordosis. Minimal grade 1 C4-5 anterolisthesis, LEFT C4-5 facets are fused on degenerative basis. SKULL BASE AND VERTEBRAE: Cervical vertebral bodies and posterior elements are intact. Moderate to severe C5-6 and C6-7 disc height loss with endplate spurring compatible with degenerative discs, moderate at C4-5. Severe LEFT upper cervical facet arthropathy. C1-2 articulation maintained with moderate arthropathy. 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 No destructive bony lesions. SOFT TISSUES AND SPINAL CANAL: Nonacute. Trace calcific atherosclerosis carotid bifurcation. DISC LEVELS: No significant osseous canal stenosis. Moderate to severe C3-4 neural foraminal narrowing. UPPER CHEST: Lung apices are clear. OTHER: None.  IMPRESSION: CT HEAD: 1. No acute intracranial process. Small RIGHT frontal scalp hematoma and laceration. No skull fracture. 2. Stable atrophy disproportionately affecting temporoparietal lobes seen with neurodegenerative disease. 3. Moderate chronic small vessel ischemic disease. CT CERVICAL SPINE: 1. No acute fracture. Minimal grade 1 C4-5 anterolisthesis on degenerative basis. 2. Moderate to severe C3-4 neural foraminal narrowing. Electronically Signed   By: Awilda Metro M.D.   On: 03/18/2017 05:42   Ct Cervical Spine Wo Contrast  Result Date: 03/18/2017 CLINICAL DATA:  Unwitnessed fall at care facility. Forehead laceration. History of Alzheimer's disease. EXAM: CT HEAD WITHOUT CONTRAST CT CERVICAL SPINE WITHOUT CONTRAST TECHNIQUE: Multidetector CT imaging of the head and cervical spine was performed following the standard protocol without intravenous contrast. Multiplanar CT image reconstructions of the cervical spine were also generated. COMPARISON:  MRI of the head February 13, 2014 FINDINGS: CT HEAD FINDINGS BRAIN: No intraparenchymal hemorrhage, mass effect nor midline shift. Moderate ventriculomegaly with disproportionate temporoparietal atrophy. Patchy to confluent supratentorial white matter hypodensities. No acute large vascular territory infarcts. No abnormal extra-axial fluid collections. VASCULAR: Moderate calcific atherosclerosis of the carotid siphons. SKULL: No skull fracture.  Small RIGHT frontal scalp hematoma with subcutaneous gas. No radiopaque foreign bodies. SINUSES/ORBITS: Tiny LEFT posterior ethmoid mucosal retention cysts, mild paranasal sinus mucosal thickening. Mastoid air cells are well aerated.The included ocular globes and orbital contents are non-suspicious. OTHER: None. CT CERVICAL SPINE FINDINGS ALIGNMENT: Straightened lordosis. Minimal grade 1 C4-5 anterolisthesis, LEFT C4-5 facets are fused on degenerative basis. SKULL BASE AND VERTEBRAE: Cervical vertebral bodies and  posterior elements are intact. Moderate to severe C5-6 and C6-7 disc height loss with endplate spurring compatible with degenerative discs, moderate at C4-5. Severe LEFT upper cervical facet arthropathy. C1-2 articulation maintained with moderate arthropathy. 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 No destructive bony lesions. SOFT TISSUES AND SPINAL CANAL: Nonacute. Trace calcific atherosclerosis carotid bifurcation. DISC LEVELS: No significant osseous canal stenosis. Moderate to severe C3-4 neural foraminal narrowing. UPPER CHEST: Lung apices are clear. OTHER: None. IMPRESSION: CT HEAD: 1. No acute intracranial process. Small RIGHT frontal scalp hematoma and laceration. No skull fracture. 2. Stable atrophy disproportionately affecting temporoparietal lobes seen with neurodegenerative disease. 3. Moderate chronic small vessel ischemic disease. CT CERVICAL SPINE: 1. No acute fracture. Minimal grade 1 C4-5 anterolisthesis on degenerative basis. 2. Moderate to severe C3-4 neural foraminal narrowing. Electronically Signed   By: Awilda Metro M.D.   On: 03/18/2017 05:42    Procedures .Marland KitchenLaceration Repair Date/Time: 03/18/2017 6:46 AM Performed by: Derwood Kaplan Authorized by: Derwood Kaplan   Consent:    Consent obtained:  Verbal   Consent given by:  Patient   Risks discussed:  Pain, poor cosmetic result, poor wound healing and infection Anesthesia (see MAR for exact dosages):    Anesthesia method:  Local infiltration   Local anesthetic:  Lidocaine 2% WITH epi Laceration details:    Location:  Face   Face location:  Forehead   Length (cm):  4 Repair type:    Repair type:  Simple Pre-procedure details:    Preparation:  Patient was prepped and draped in usual sterile fashion Exploration:    Wound extent: no muscle damage noted, no tendon damage noted, no underlying fracture noted and no vascular damage noted   Treatment:    Amount of cleaning:  Standard    Irrigation solution:  Sterile water   Irrigation method:  Syringe Mucous membrane repair:    Wound mucous membrane closure material used: prolene.   Number of sutures:  5 Skin repair:    Repair method:  Sutures   Suture size:  5-0   Suture material:  Prolene   Suture technique:  Simple interrupted   Number of sutures:  5 Post-procedure details:    Dressing:  Sterile dressing   (including critical care time)   Medications Ordered in ED Medications  lidocaine-EPINEPHrine (XYLOCAINE W/EPI) 2 %-1:200000 (PF) injection 10 mL (10 mLs Infiltration Given by Other 03/18/17 1610)     Initial Impression / Assessment and Plan / ED Course  I have reviewed the triage vital signs and the nursing notes.  Pertinent labs & imaging results that were available during my care of the patient were reviewed by me and considered in my medical decision making (see chart for details).     DDx includes: - Mechanical falls - ICH - Fractures - Contusions - Soft tissue injury  Lac will be repaired. It seems like his tetanus is updated.   Final Clinical Impressions(s) / ED Diagnoses   Final diagnoses:  Injury of head, initial encounter  Laceration of forehead, initial encounter    New Prescriptions Discharge  Medication List as of 03/18/2017  6:36 AM       Derwood Kaplan, MD 03/18/17 [id number]

## 2017-05-18 ENCOUNTER — Telehealth: Payer: Self-pay

## 2017-05-18 NOTE — Telephone Encounter (Signed)
Palliative Medicine RN Note: Rec'd a call from Mrs Veatrice KellsCaleca on the main palliative line. This patient has NOT been seen by our team. She called with concerns about patient's care at the facility and by hospice. I called her back and repeatedly referred her back to the hospice provider [facility] Our team in unable to help him, as we only see patients actively admitted to the hospital, and we have no association with any outpatient providers.  Margret ChanceMelanie G. Morio Widen, RN, BSN, [facility] 05/18/2017 3:43 PM [phone]

## 2017-06-21 ENCOUNTER — Encounter (HOSPITAL_COMMUNITY): Payer: Self-pay

## 2017-06-21 ENCOUNTER — Other Ambulatory Visit: Payer: Self-pay

## 2017-06-21 ENCOUNTER — Emergency Department (HOSPITAL_COMMUNITY)
Admission: EM | Admit: 2017-06-21 | Discharge: 2017-06-21 | Disposition: A | Discharge status: Skilled Nursing Facility | Payer: Medicare Other | Attending: Emergency Medicine | Admitting: Emergency Medicine

## 2017-06-21 DIAGNOSIS — E039 Hypothyroidism, unspecified: Secondary | ICD-10-CM | POA: Insufficient documentation

## 2017-06-21 DIAGNOSIS — R451 Restlessness and agitation: Secondary | ICD-10-CM | POA: Insufficient documentation

## 2017-06-21 DIAGNOSIS — F0391 Unspecified dementia with behavioral disturbance: Secondary | ICD-10-CM | POA: Insufficient documentation

## 2017-06-21 DIAGNOSIS — Z87891 Personal history of nicotine dependence: Secondary | ICD-10-CM | POA: Diagnosis not present

## 2017-06-21 DIAGNOSIS — Z79899 Other long term (current) drug therapy: Secondary | ICD-10-CM | POA: Diagnosis not present

## 2017-06-21 DIAGNOSIS — R4182 Altered mental status, unspecified: Secondary | ICD-10-CM | POA: Diagnosis present

## 2017-06-21 LAB — COMPREHENSIVE METABOLIC PANEL
ALT: 12 U/L — AB (ref 17–63)
AST: 17 U/L (ref 15–41)
Albumin: 3.4 g/dL — ABNORMAL LOW (ref 3.5–5.0)
Alkaline Phosphatase: 53 U/L (ref 38–126)
Anion gap: 4 — ABNORMAL LOW (ref 5–15)
BUN: 24 mg/dL — ABNORMAL HIGH (ref 6–20)
CO2: 29 mmol/L (ref 22–32)
Calcium: 8.8 mg/dL — ABNORMAL LOW (ref 8.9–10.3)
Chloride: 108 mmol/L (ref 101–111)
Creatinine, Ser: 0.79 mg/dL (ref 0.61–1.24)
GFR calc non Af Amer: >60 mL/min (ref >60)
Glucose, Bld: 86 mg/dL (ref 65–99)
POTASSIUM: 3.8 mmol/L (ref 3.5–5.1)
SODIUM: 141 mmol/L (ref 135–145)
Total Bilirubin: 0.5 mg/dL (ref 0.3–1.2)
Total Protein: 6.1 g/dL — ABNORMAL LOW (ref 6.5–8.1)

## 2017-06-21 LAB — CBC WITH DIFFERENTIAL/PLATELET
Basophils Absolute: 0 10*3/uL (ref 0.0–0.1)
Basophils Relative: 0 %
Eosinophils Absolute: 0.2 10*3/uL (ref 0.0–0.7)
Eosinophils Relative: 6 %
HCT: 32.9 % — ABNORMAL LOW (ref 39.0–52.0)
HEMOGLOBIN: 10.8 g/dL — AB (ref 13.0–17.0)
LYMPHS ABS: 1.1 10*3/uL (ref 0.7–4.0)
Lymphocytes Relative: 31 %
MCH: 30.6 pg (ref 26.0–34.0)
MCHC: 32.8 g/dL (ref 30.0–36.0)
MCV: 93.2 fL (ref 78.0–100.0)
Monocytes Absolute: 0.5 10*3/uL (ref 0.1–1.0)
Monocytes Relative: 13 %
NEUTROS PCT: 50 %
Neutro Abs: 1.8 10*3/uL (ref 1.7–7.7)
Platelets: 152 10*3/uL (ref 150–400)
RBC: 3.53 MIL/uL — AB (ref 4.22–5.81)
RDW: 13.4 % (ref 11.5–15.5)
WBC: 3.5 10*3/uL — AB (ref 4.0–10.5)

## 2017-06-21 LAB — ETHANOL: Alcohol, Ethyl (B): <10 mg/dL (ref <10)

## 2017-06-21 LAB — VALPROIC ACID LEVEL: VALPROIC ACID LVL: 31 ug/mL — AB (ref 50.0–100.0)

## 2017-06-21 NOTE — ED Notes (Signed)
Bed: WA10 Expected date: 06/21/17 Expected time: 8:02 AM Means of arrival: Ambulance Comments:

## 2017-06-21 NOTE — ED Notes (Signed)
Bed: WLPT1 Expected date:  Expected time:  Means of arrival:  Comments: 
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
## 2017-06-21 NOTE — ED Notes (Signed)
Pt wife refusing further testing
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
## 2017-06-21 NOTE — Discharge Instructions (Signed)
Follow up with a palliative care doctor as an outpatient as we discussed

## 2017-07-23 ENCOUNTER — Emergency Department (HOSPITAL_COMMUNITY)
Admission: EM | Admit: 2017-07-23 | Discharge: 2017-07-23 | Disposition: A | Discharge status: Home / Self Care | Payer: Medicare Other | Attending: Emergency Medicine | Admitting: Emergency Medicine

## 2017-07-23 ENCOUNTER — Encounter (HOSPITAL_COMMUNITY): Payer: Self-pay | Admitting: Emergency Medicine

## 2017-07-23 ENCOUNTER — Other Ambulatory Visit: Payer: Self-pay

## 2017-07-23 DIAGNOSIS — E039 Hypothyroidism, unspecified: Secondary | ICD-10-CM | POA: Diagnosis not present

## 2017-07-23 DIAGNOSIS — F0281 Dementia in other diseases classified elsewhere with behavioral disturbance: Secondary | ICD-10-CM | POA: Diagnosis not present

## 2017-07-23 DIAGNOSIS — Z87891 Personal history of nicotine dependence: Secondary | ICD-10-CM | POA: Insufficient documentation

## 2017-07-23 DIAGNOSIS — G309 Alzheimer's disease, unspecified: Secondary | ICD-10-CM | POA: Diagnosis not present

## 2017-07-23 DIAGNOSIS — Z79899 Other long term (current) drug therapy: Secondary | ICD-10-CM | POA: Insufficient documentation

## 2017-07-23 DIAGNOSIS — R4589 Other symptoms and signs involving emotional state: Secondary | ICD-10-CM | POA: Diagnosis not present

## 2017-07-23 DIAGNOSIS — I10 Essential (primary) hypertension: Secondary | ICD-10-CM | POA: Diagnosis present
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
## 2017-07-23 NOTE — ED Notes (Signed)
Pt's wife stepped out to make a phone call and asked staff to sit with the pt until she got back. The wife came back and appeared to be angry demanding the writer to get unhook him from the monitor and to tell the nurse that they are leaving and she is taking him back to the nursing home. Pt's wife would not stay to talk to the nurse or doctor and refused help from staff getting the pt to her car. RN notified.
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
## 2018-11-14 ENCOUNTER — Encounter (HOSPITAL_COMMUNITY): Payer: Self-pay | Admitting: *Deleted

## 2018-11-14 ENCOUNTER — Other Ambulatory Visit: Payer: Self-pay

## 2018-11-14 ENCOUNTER — Emergency Department (HOSPITAL_COMMUNITY)
Admission: EM | Admit: 2018-11-14 | Discharge: 2018-11-14 | Disposition: A | Discharge status: Home / Self Care | Payer: Medicare Other | Attending: Emergency Medicine | Admitting: Emergency Medicine

## 2018-11-14 ENCOUNTER — Emergency Department (HOSPITAL_COMMUNITY): Payer: Medicare Other

## 2018-11-14 DIAGNOSIS — E039 Hypothyroidism, unspecified: Secondary | ICD-10-CM | POA: Insufficient documentation

## 2018-11-14 DIAGNOSIS — Y92129 Unspecified place in nursing home as the place of occurrence of the external cause: Secondary | ICD-10-CM | POA: Diagnosis not present

## 2018-11-14 DIAGNOSIS — G309 Alzheimer's disease, unspecified: Secondary | ICD-10-CM | POA: Diagnosis not present

## 2018-11-14 DIAGNOSIS — Y999 Unspecified external cause status: Secondary | ICD-10-CM | POA: Diagnosis not present

## 2018-11-14 DIAGNOSIS — S0990XA Unspecified injury of head, initial encounter: Secondary | ICD-10-CM | POA: Diagnosis present

## 2018-11-14 DIAGNOSIS — S0101XA Laceration without foreign body of scalp, initial encounter: Secondary | ICD-10-CM | POA: Diagnosis not present

## 2018-11-14 DIAGNOSIS — Z87891 Personal history of nicotine dependence: Secondary | ICD-10-CM | POA: Insufficient documentation

## 2018-11-14 DIAGNOSIS — Z79899 Other long term (current) drug therapy: Secondary | ICD-10-CM | POA: Insufficient documentation

## 2018-11-14 DIAGNOSIS — F0280 Dementia in other diseases classified elsewhere without behavioral disturbance: Secondary | ICD-10-CM | POA: Diagnosis not present

## 2018-11-14 DIAGNOSIS — Y939 Activity, unspecified: Secondary | ICD-10-CM | POA: Insufficient documentation

## 2018-11-14 DIAGNOSIS — W010XXA Fall on same level from slipping, tripping and stumbling without subsequent striking against object, initial encounter: Secondary | ICD-10-CM | POA: Diagnosis not present

## 2018-11-14 DIAGNOSIS — W19XXXA Unspecified fall, initial encounter: Secondary | ICD-10-CM

## 2018-11-14 HISTORY — DX: Vitamin D deficiency, unspecified: E55.9

## 2018-11-14 HISTORY — DX: Other acute osteomyelitis, left ankle and foot: M86.172

## 2018-11-14 HISTORY — DX: Anemia, unspecified: D64.9

## 2018-11-14 MED ORDER — MELATONIN 3 MG PO TABS
3.00 | ORAL_TABLET | ORAL | Status: DC
Start: 2018-11-14 — End: 2018-11-14

## 2018-11-14 MED ORDER — TETANUS-DIPHTH-ACELL PERTUSSIS 5-2.5-18.5 LF-MCG/0.5 IM SUSP
0.5000 mL | Freq: Once | INTRAMUSCULAR | Status: DC
  Filled 2018-11-14: qty 0.5

## 2018-11-14 MED ORDER — LIDOCAINE-EPINEPHRINE 2 %-1:100000 IJ SOLN
20.0000 mL | Freq: Once | INTRAMUSCULAR | Status: DC
  Filled 2018-11-14: qty 1

## 2018-11-14 MED ORDER — R-TANNIC-S A/D 30-5 MG/5ML PO SUSP
200.00 | ORAL | Status: DC
Start: 2018-11-14 — End: 2018-11-14

## 2018-11-14 MED ORDER — Medication
0.08 | Status: DC
Start: ? — End: 2018-11-14

## 2018-11-14 MED ORDER — GENERIC EXTERNAL MEDICATION
.04 | Status: DC
Start: ? — End: 2018-11-14

## 2018-11-14 MED ORDER — GENERIC EXTERNAL MEDICATION
Status: DC
Start: ? — End: 2018-11-14

## 2018-11-14 MED ORDER — ACETAMINOPHEN 325 MG PO TABS
650.00 | ORAL_TABLET | ORAL | Status: DC
Start: ? — End: 2018-11-14

## 2018-11-14 MED ORDER — CHLOROPHYLL EX
10.00 | CUTANEOUS | Status: DC
Start: ? — End: 2018-11-14

## 2018-11-14 MED ORDER — NITROGLYCERIN 0.4 MG SL SUBL
.40 | SUBLINGUAL_TABLET | SUBLINGUAL | Status: DC
Start: ? — End: 2018-11-14

## 2018-11-14 MED ORDER — LORAZEPAM 2 MG/ML IJ SOLN
1.0000 mg | Freq: Once | INTRAMUSCULAR | Status: AC
  Administered 2018-11-14: 1 mg via INTRAMUSCULAR
  Filled 2018-11-14: qty 1

## 2018-11-14 MED ORDER — ACETAMINOPHEN 325 MG PO TABS
650.00 | ORAL_TABLET | ORAL | Status: DC
Start: 2018-11-14 — End: 2018-11-14

## 2018-11-14 MED ORDER — ESTROPLUS PO TABS
1.00 | ORAL_TABLET | ORAL | Status: DC
Start: ? — End: 2018-11-14

## 2018-11-14 MED ORDER — Medication
Status: DC
Start: ? — End: 2018-11-14

## 2018-11-14 MED ORDER — POLYETHYLENE GLYCOL 3350 17 G PO PACK
17.00 | PACK | ORAL | Status: DC
Start: ? — End: 2018-11-14

## 2018-11-14 MED ORDER — BUSPIRONE HCL 5 MG PO TABS
10.00 | ORAL_TABLET | ORAL | Status: DC
Start: 2018-11-14 — End: 2018-11-14

## 2018-11-14 MED ORDER — LORAZEPAM 2 MG/ML IJ SOLN
2.00 | INTRAMUSCULAR | Status: DC
Start: ? — End: 2018-11-14

## 2018-11-14 MED ORDER — SERTRALINE HCL 100 MG PO TABS
100.00 | ORAL_TABLET | ORAL | Status: DC
Start: 2018-11-15 — End: 2018-11-14

## 2018-11-14 MED ORDER — MORPHINE SULFATE (PF) 4 MG/ML IV SOLN
4.0000 mg | Freq: Once | INTRAVENOUS | Status: AC
  Administered 2018-11-14: 20:00:00 4 mg via INTRAMUSCULAR
  Filled 2018-11-14: qty 1

## 2018-11-14 NOTE — ED Notes (Signed)
Bed: WA14 Expected date:  Expected time:  Means of arrival:  Comments: 

## 2018-11-14 NOTE — ED Notes (Addendum)
PTAR contacted. Paperwork printed

## 2018-11-14 NOTE — ED Triage Notes (Signed)
Pt just arrived at [facility], He fell and hit head leaving 2 laceration approx 2 in rt eyebrow and left forehead. Bleeding controlled with bandage.

## 2018-11-14 NOTE — ED Notes (Signed)
PA and MD at bedside speaking with pt and wife

## 2018-11-14 NOTE — ED Notes (Signed)
Bed: FB37 Expected date:  Expected time:  Means of arrival:  Comments: EMS- Fall w/ Head Lac
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
## 2018-11-14 NOTE — ED Notes (Addendum)
Pt has been placed on stretcher with with bed alarm and safety belt as he is constantly trying to get out of bed. Unable to obtain full assessment due to AMS. Pt is not redirectable. Pt now has hand mitts on. HE WILL BITE YOU IF YOU GET TOO CLOSE TO HIS MOUTH.

## 2018-11-14 NOTE — ED Notes (Signed)
PTAR at bedside 

## 2018-11-14 NOTE — ED Notes (Signed)
Pt wife requesting to speak with provider. Provider made aware.

## 2018-11-14 NOTE — ED Notes (Addendum)
While at bedside setting up lac tray, wife informed me that she wants to change care to "comfort care" only. MD and PA notified. PA at bedside.

## 2018-11-15 ENCOUNTER — Emergency Department (HOSPITAL_COMMUNITY)
Admission: EM | Admit: 2018-11-15 | Discharge: 2018-11-15 | Disposition: A | Discharge status: Home / Self Care | Payer: Medicare Other | Attending: Emergency Medicine | Admitting: Emergency Medicine

## 2018-11-15 ENCOUNTER — Other Ambulatory Visit: Payer: Self-pay

## 2018-11-15 DIAGNOSIS — Y92129 Unspecified place in nursing home as the place of occurrence of the external cause: Secondary | ICD-10-CM | POA: Insufficient documentation

## 2018-11-15 DIAGNOSIS — S0181XA Laceration without foreign body of other part of head, initial encounter: Secondary | ICD-10-CM | POA: Insufficient documentation

## 2018-11-15 DIAGNOSIS — S0081XA Abrasion of other part of head, initial encounter: Secondary | ICD-10-CM | POA: Diagnosis not present

## 2018-11-15 DIAGNOSIS — Z79899 Other long term (current) drug therapy: Secondary | ICD-10-CM | POA: Diagnosis not present

## 2018-11-15 DIAGNOSIS — W010XXA Fall on same level from slipping, tripping and stumbling without subsequent striking against object, initial encounter: Secondary | ICD-10-CM | POA: Insufficient documentation

## 2018-11-15 DIAGNOSIS — Z87891 Personal history of nicotine dependence: Secondary | ICD-10-CM | POA: Diagnosis not present

## 2018-11-15 DIAGNOSIS — F0280 Dementia in other diseases classified elsewhere without behavioral disturbance: Secondary | ICD-10-CM | POA: Diagnosis not present

## 2018-11-15 DIAGNOSIS — E039 Hypothyroidism, unspecified: Secondary | ICD-10-CM | POA: Diagnosis not present

## 2018-11-15 DIAGNOSIS — G309 Alzheimer's disease, unspecified: Secondary | ICD-10-CM | POA: Diagnosis not present

## 2018-11-15 DIAGNOSIS — Y939 Activity, unspecified: Secondary | ICD-10-CM | POA: Insufficient documentation

## 2018-11-15 DIAGNOSIS — S0993XA Unspecified injury of face, initial encounter: Secondary | ICD-10-CM | POA: Diagnosis present

## 2018-11-15 DIAGNOSIS — Y999 Unspecified external cause status: Secondary | ICD-10-CM | POA: Insufficient documentation

## 2018-11-15 MED ORDER — Medication
Status: DC
Start: ? — End: 2018-11-15

## 2018-11-15 NOTE — ED Notes (Signed)
Patient's spouse was called by this Technical sales engineer. Patient's spouse did not answer the phone. ED Staff was unable to update spouse on plan of care.

## 2018-11-15 NOTE — ED Notes (Signed)
Pt presents with mittens; seizure pads applied to side rails for extra cushion.

## 2018-11-15 NOTE — ED Notes (Signed)
Attempted to call report to facility but received no answer.

## 2018-11-15 NOTE — ED Notes (Signed)
[facility] at bedside to transport patient back to the facility. Restraints moved prior to discharge. Patient stable upon discharge. Patient unable to sign for discharge due to dementia.

## 2018-11-15 NOTE — ED Provider Notes (Signed)
  Face-to-face evaluation   History: Bruce Fleming for evaluation of injury face secondary to fall.  Similar several days ago.  Physical exam: Elderly patient who is confused but alert and responsive.  Contusion with laceration, right forehead.  Free active range of motion of the neck.  Moves arms normally.   Medical screening examination/treatment/procedure(s) were conducted as a shared visit with non-physician practitioner(s) and myself.  I personally evaluated the patient during the encounter    Mancel Bale, MD 11/22/18 1454

## 2018-11-15 NOTE — Discharge Instructions (Addendum)
Return here as needed. °

## 2018-11-15 NOTE — ED Notes (Signed)
Pt comes with DNR form facility, form at bedside
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
## 2019-01-16 ENCOUNTER — Emergency Department (HOSPITAL_COMMUNITY)
Admission: EM | Admit: 2019-01-16 | Discharge: 2019-01-16 | Disposition: A | Discharge status: Home / Self Care | Payer: Medicare Other | Attending: Emergency Medicine | Admitting: Emergency Medicine

## 2019-01-16 ENCOUNTER — Emergency Department (HOSPITAL_COMMUNITY): Payer: Medicare Other

## 2019-01-16 ENCOUNTER — Other Ambulatory Visit: Payer: Self-pay

## 2019-01-16 DIAGNOSIS — S60512A Abrasion of left hand, initial encounter: Secondary | ICD-10-CM | POA: Diagnosis not present

## 2019-01-16 DIAGNOSIS — Y999 Unspecified external cause status: Secondary | ICD-10-CM | POA: Insufficient documentation

## 2019-01-16 DIAGNOSIS — S51001A Unspecified open wound of right elbow, initial encounter: Secondary | ICD-10-CM | POA: Insufficient documentation

## 2019-01-16 DIAGNOSIS — Y92129 Unspecified place in nursing home as the place of occurrence of the external cause: Secondary | ICD-10-CM | POA: Insufficient documentation

## 2019-01-16 DIAGNOSIS — F0390 Unspecified dementia without behavioral disturbance: Secondary | ICD-10-CM | POA: Diagnosis not present

## 2019-01-16 DIAGNOSIS — Y9389 Activity, other specified: Secondary | ICD-10-CM | POA: Diagnosis not present

## 2019-01-16 DIAGNOSIS — T07XXXA Unspecified multiple injuries, initial encounter: Secondary | ICD-10-CM

## 2019-01-16 DIAGNOSIS — S40811A Abrasion of right upper arm, initial encounter: Secondary | ICD-10-CM | POA: Insufficient documentation

## 2019-01-16 DIAGNOSIS — S60511A Abrasion of right hand, initial encounter: Secondary | ICD-10-CM | POA: Insufficient documentation

## 2019-01-16 DIAGNOSIS — E039 Hypothyroidism, unspecified: Secondary | ICD-10-CM | POA: Diagnosis not present

## 2019-01-16 DIAGNOSIS — G309 Alzheimer's disease, unspecified: Secondary | ICD-10-CM | POA: Diagnosis not present

## 2019-01-16 DIAGNOSIS — Z79899 Other long term (current) drug therapy: Secondary | ICD-10-CM | POA: Diagnosis not present

## 2019-01-16 DIAGNOSIS — Z87891 Personal history of nicotine dependence: Secondary | ICD-10-CM | POA: Insufficient documentation

## 2019-01-16 DIAGNOSIS — S59901A Unspecified injury of right elbow, initial encounter: Secondary | ICD-10-CM | POA: Diagnosis present

## 2019-01-16 NOTE — ED Notes (Addendum)
Pt wife called and states, "I want him put in a bed and given no food or water until he passes." She states that she has all the necessary paperwork and demands "comfort care" and no testing. MD and PA made aware.
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
## 2019-01-17 ENCOUNTER — Encounter: Payer: Self-pay | Admitting: Internal Medicine

## 2019-01-17 NOTE — Progress Notes (Unsigned)
    [facility] Consult Note Telephone: [phone]  Fax: [phone]  PATIENT NAME: Bruce Fleming DOB: 01/01/1946 MRN: [id number]  PRIMARY CARE PROVIDER:   System, Pcp Not In  REFERRING PROVIDER:  No referring provider defined for this encounter.  RESPONSIBLE PARTY:     ASSESSMENT:        RECOMMENDATIONS and PLAN:  1.  I spent *** minutes providing this consultation,  from *** to ***. More than 50% of the time in this consultation was spent coordinating communication.   HISTORY OF PRESENT ILLNESS:  Bruce Fleming is a 73 y.o. year old male with multiple medical problems including ***. Palliative Care was asked to help address goals of care.   CODE STATUS:   PPS: 0% HOSPICE ELIGIBILITY/DIAGNOSIS: TBD  PAST MEDICAL HISTORY:  Past Medical History:  Diagnosis Date  . Alzheimer's dementia ([place])   . Anemia   . Hyperlipidemia   . Osteomyelitis of ankle or foot, left, acute ([place])   . Thyroid disease   . Vitamin D deficiency     SOCIAL HX:  Social History   Tobacco Use  . Smoking status: Former Smoker    Years: 10.00    Types: Cigarettes    Quit date: 06/16/1978    Years since quitting: 40.6  . Smokeless tobacco: Never Used  Substance Use Topics  . Alcohol use: Yes    Comment: SPECIAL OCCASSION    ALLERGIES: No Known Allergies   PERTINENT MEDICATIONS:  Outpatient Encounter Medications as of 01/17/2019  Medication Sig  . diphenhydrAMINE (BENADRYL) 25 mg capsule Take 25 mg by mouth at bedtime.  . divalproex (DEPAKOTE) 250 MG DR tablet Take 250 mg by mouth 2 (two) times daily. Takes at 0700, 1400  . divalproex (DEPAKOTE) 500 MG DR tablet Take 500 mg by mouth at bedtime.  Marland Kitchen QUEtiapine (SEROQUEL) 100 MG tablet Take 100 mg by mouth 2 (two) times daily. 100 MG EVERY MORNING AND 200 MG EVERY EVENING  . sertraline (ZOLOFT) 25 MG tablet Take 12.5 mg by mouth daily.   Marland Kitchen triamcinolone cream (KENALOG) 0.1 % Apply 1  application topically as needed. 2 TIMES PER DAY ON HANDS   No facility-administered encounter medications on file as of 01/17/2019.     PHYSICAL EXAM:   General: NAD, frail appearing, thin Cardiovascular: regular rate and rhythm Pulmonary: clear ant fields Abdomen: soft, nontender, + bowel sounds GU: no suprapubic tenderness Extremities: no edema, no joint deformities Skin: no rashes Neurological: Weakness but otherwise nonfocal  Julianne Handler, NP

## 2019-01-21 ENCOUNTER — Non-Acute Institutional Stay: Payer: Self-pay | Admitting: Internal Medicine

## 2019-01-21 DIAGNOSIS — Z515 Encounter for palliative care: Secondary | ICD-10-CM

## 2019-02-09 ENCOUNTER — Encounter: Payer: Self-pay | Admitting: Internal Medicine
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
## 2019-03-08 ENCOUNTER — Other Ambulatory Visit: Payer: Self-pay

## 2019-03-08 ENCOUNTER — Emergency Department (HOSPITAL_COMMUNITY)
Admission: EM | Admit: 2019-03-08 | Discharge: 2019-03-08 | Disposition: A | Discharge status: Home / Self Care | Attending: Emergency Medicine | Admitting: Emergency Medicine

## 2019-03-08 DIAGNOSIS — W0110XA Fall on same level from slipping, tripping and stumbling with subsequent striking against unspecified object, initial encounter: Secondary | ICD-10-CM | POA: Diagnosis not present

## 2019-03-08 DIAGNOSIS — Y999 Unspecified external cause status: Secondary | ICD-10-CM | POA: Insufficient documentation

## 2019-03-08 DIAGNOSIS — G309 Alzheimer's disease, unspecified: Secondary | ICD-10-CM | POA: Insufficient documentation

## 2019-03-08 DIAGNOSIS — Y92099 Unspecified place in other non-institutional residence as the place of occurrence of the external cause: Secondary | ICD-10-CM | POA: Diagnosis not present

## 2019-03-08 DIAGNOSIS — T148XXA Other injury of unspecified body region, initial encounter: Secondary | ICD-10-CM | POA: Insufficient documentation

## 2019-03-08 DIAGNOSIS — Z79899 Other long term (current) drug therapy: Secondary | ICD-10-CM | POA: Diagnosis not present

## 2019-03-08 DIAGNOSIS — F0281 Dementia in other diseases classified elsewhere with behavioral disturbance: Secondary | ICD-10-CM | POA: Insufficient documentation

## 2019-03-08 DIAGNOSIS — Y939 Activity, unspecified: Secondary | ICD-10-CM | POA: Diagnosis not present

## 2019-03-08 DIAGNOSIS — S0990XA Unspecified injury of head, initial encounter: Secondary | ICD-10-CM | POA: Diagnosis present

## 2019-03-08 DIAGNOSIS — E039 Hypothyroidism, unspecified: Secondary | ICD-10-CM | POA: Insufficient documentation

## 2019-03-08 NOTE — ED Notes (Signed)
Pt multiple attempts to get out of bed, swinging his legs over the side rail trying to crawl out.  Pt in late stage dementia and cannot be redirected verbally.  Posey belt placed on pt.  Pt in no distress, vital signs WNL.

## 2019-03-08 NOTE — ED Notes (Signed)
Restraint taken off for patient to be discharged.

## 2019-03-08 NOTE — ED Provider Notes (Signed)
[facility]-EMERGENCY DEPT Provider Note   CSN: [id number] Arrival date & time: 03/08/19  1827     History   Chief Complaint No chief complaint on file.   HPI Bruce Fleming is a 73 y.o. male.     HPI With advanced dementia.  Unable to contribute to history.  Level 5 caveat applies.  Brought in by EMS after witnessed fall.  Patient fell from standing struck his head.  There was no loss of consciousness.  Sustained hematoma to the forehead.  Has remained at his baseline mental status.  Patient did fall earlier today and sustained some skin tears to his bilateral elbows.  Patient is not on a blood thinner. Past Medical History:  Diagnosis Date  . Alzheimer's dementia (HCC)   . Anemia   . Hyperlipidemia   . Osteomyelitis of ankle or foot, left, acute (HCC)   . Thyroid disease   . Vitamin D deficiency     Patient Active Problem List   Diagnosis Date Noted  . Symptomatic sinus bradycardia 08/26/2016  . Loss of consciousness (HCC) 08/25/2016  . Alzheimer's dementia with behavioral disturbance (HCC) 03/06/2016  . Hypothyroid 04/07/2013  . Hyperlipidemia 04/07/2013  . Memory loss, short term 04/07/2013    No past surgical history on file.      Home Medications    Prior to Admission medications   Medication Sig Start Date End Date Taking? Authorizing Provider  diphenhydrAMINE (BENADRYL) 25 mg capsule Take 25 mg by mouth at bedtime.    [provider]  divalproex (DEPAKOTE) 250 MG DR tablet Take 250 mg by mouth 2 (two) times daily. Takes at 0700, 1400    [provider]  divalproex (DEPAKOTE) 500 MG DR tablet Take 500 mg by mouth at bedtime.    [provider]  QUEtiapine (SEROQUEL) 100 MG tablet Take 100 mg by mouth 2 (two) times daily. 100 MG EVERY MORNING AND 200 MG EVERY EVENING    [provider]  sertraline (ZOLOFT) 25 MG tablet Take 12.5 mg by mouth daily.     [provider]  triamcinolone  cream (KENALOG) 0.1 % Apply 1 application topically as needed. 2 TIMES PER DAY ON HANDS        Family History Family History  Problem Relation Age of Onset  . Alzheimer's disease Mother   . Alzheimer's disease Maternal Grandmother     Social History Social History   Tobacco Use  . Smoking status: Former Smoker    Years: 10.00    Types: Cigarettes    Quit date: 06/16/1978    Years since quitting: 40.7  . Smokeless tobacco: Never Used  Substance Use Topics  . Alcohol use: Yes    Comment: SPECIAL OCCASSION  . Drug use: Not on file     Allergies   Patient has no known allergies.   Review of Systems Review of Systems  Unable to perform ROS: Dementia     Physical Exam Updated Vital Signs BP 112/78   Pulse 63   Temp (!) 97 F (36.1 C)   Resp 18   SpO2 100%   Physical Exam Vitals signs and nursing note reviewed.  Constitutional:      Appearance: Normal appearance. He is well-developed.  HENT:     Head: Normocephalic.     Comments: Mid forehead hematoma with small abrasion overlying.  Midface is stable.  No malocclusion.    Nose: Nose normal.     Mouth/Throat:  Mouth: Mucous membranes are moist.  Eyes:     Pupils: Pupils are equal, round, and reactive to light.  Neck:     Musculoskeletal: Normal range of motion and neck supple.     Comments: No apparent posterior midline cervical tenderness to palpation. Cardiovascular:     Rate and Rhythm: Normal rate and regular rhythm.     Heart sounds: No murmur. No friction rub. No gallop.   Pulmonary:     Effort: Pulmonary effort is normal. No respiratory distress.     Breath sounds: Normal breath sounds. No stridor. No wheezing, rhonchi or rales.  Chest:     Chest wall: No tenderness.  Abdominal:     General: Bowel sounds are normal.     Palpations: Abdomen is soft.     Tenderness: There is no abdominal tenderness. There is no guarding or rebound.  Musculoskeletal: Normal range of motion.        General: No  swelling, tenderness, deformity or signs of injury.     Right lower leg: No edema.     Left lower leg: No edema.     Comments: Pelvis is stable.  Skin:    General: Skin is warm and dry.     Capillary Refill: Capillary refill takes less than 2 seconds.     Findings: No erythema or rash.     Comments: Skin tears to bilateral elbows.  Full range of motion of elbows without obvious deformity, effusion.    Neurological:     General: No focal deficit present.     Mental Status: He is alert.     Comments: Confused.  Appears to be moving all extremities without deficit.  Patient does have some rigidity to his extremities.  Psychiatric:        Behavior: Behavior normal.      ED Treatments / Results  Labs (all labs ordered are listed, but only abnormal results are displayed) Labs Reviewed - No data to display  EKG None  Radiology No results found.  Procedures Procedures (including critical care time)  Medications Ordered in ED Medications - No data to display   Initial Impression / Assessment and Plan / ED Course  I have reviewed the triage vital signs and the nursing notes.  Pertinent labs & imaging results that were available during my care of the patient were reviewed by me and considered in my medical decision making (see chart for details).        Wife called and is not wanting extensive work-up.  Patient is not on blood thinners.  DNR.  At his baseline mental status.  He has been observed an hour and a half in the emergency department.  Skin tears have been treated.  Will discharge back to nursing home.  Head injury precautions given.  Final Clinical Impressions(s) / ED Diagnoses   Final diagnoses:  Closed head injury, initial encounter  Multiple skin tears    ED Discharge Orders    None       Julianne Rice, MD 03/08/19 [phone]

## 2019-03-08 NOTE — ED Triage Notes (Signed)
Per ems Pt from [facility], pt fell 2x today. Second fall was witnessed, and he hit his head.  Lac/hematoma to R eyebrow. Pt hx dementia. Pt DNR.  Pt not on blood thinners. Pt's wife called and she states she doesn't want "any big tests or anything done", and that his eyesight is very poor.

## 2019-03-24 ENCOUNTER — Emergency Department (HOSPITAL_COMMUNITY)
Admission: EM | Admit: 2019-03-24 | Discharge: 2019-03-24 | Disposition: A | Discharge status: Skilled Nursing Facility | Attending: Emergency Medicine | Admitting: Emergency Medicine

## 2019-03-24 ENCOUNTER — Emergency Department (HOSPITAL_COMMUNITY)

## 2019-03-24 ENCOUNTER — Encounter (HOSPITAL_COMMUNITY): Payer: Self-pay | Admitting: Emergency Medicine

## 2019-03-24 ENCOUNTER — Other Ambulatory Visit: Payer: Self-pay

## 2019-03-24 DIAGNOSIS — S0101XA Laceration without foreign body of scalp, initial encounter: Secondary | ICD-10-CM | POA: Insufficient documentation

## 2019-03-24 DIAGNOSIS — E039 Hypothyroidism, unspecified: Secondary | ICD-10-CM | POA: Diagnosis not present

## 2019-03-24 DIAGNOSIS — Y92128 Other place in nursing home as the place of occurrence of the external cause: Secondary | ICD-10-CM | POA: Diagnosis not present

## 2019-03-24 DIAGNOSIS — S0990XA Unspecified injury of head, initial encounter: Secondary | ICD-10-CM | POA: Diagnosis present

## 2019-03-24 DIAGNOSIS — Y999 Unspecified external cause status: Secondary | ICD-10-CM | POA: Diagnosis not present

## 2019-03-24 DIAGNOSIS — W19XXXA Unspecified fall, initial encounter: Secondary | ICD-10-CM | POA: Diagnosis not present

## 2019-03-24 DIAGNOSIS — F0280 Dementia in other diseases classified elsewhere without behavioral disturbance: Secondary | ICD-10-CM | POA: Insufficient documentation

## 2019-03-24 DIAGNOSIS — Z87891 Personal history of nicotine dependence: Secondary | ICD-10-CM | POA: Insufficient documentation

## 2019-03-24 DIAGNOSIS — S51012A Laceration without foreign body of left elbow, initial encounter: Secondary | ICD-10-CM | POA: Diagnosis not present

## 2019-03-24 DIAGNOSIS — Y939 Activity, unspecified: Secondary | ICD-10-CM | POA: Insufficient documentation

## 2019-03-24 DIAGNOSIS — G309 Alzheimer's disease, unspecified: Secondary | ICD-10-CM | POA: Diagnosis not present

## 2019-03-24 NOTE — ED Triage Notes (Signed)
73 yo male from [facility] S/P fall unwitnessed. History of frequent falls, Alzheimer dementia patient. He is AOx1 the nursing staff at the facility states he is acting his normal self. Pt has a DNR and is not on blood thinners. Pt has skin tear on right elbow and small lac on right temple that does not seem to be from today's fall. Vitals: bp 108/68 Hr 72 rr 18 98% on room air  cbg 97

## 2019-03-24 NOTE — ED Notes (Signed)
Patient transported to X-ray 

## 2019-03-24 NOTE — ED Notes (Signed)
[facility] called for patient transport back to [facility]. Report given to receiving nurse Varney Biles.

## 2019-03-24 NOTE — Discharge Instructions (Signed)
Continue your current meds   Fall precaution   We applied dermabond to your scalp wound and it will fall off in several days   See your doctor  Return to ER if you have another fall, lethargy, vomiting, agitation

## 2019-03-24 NOTE — ED Provider Notes (Signed)
[facility] DEPT Provider Note   CSN: [id number] Arrival date & time: 03/24/19  1653     History   Chief Complaint No chief complaint on file.   HPI Bruce Fleming is a 73 y.o. male hx of dementia, anemia, HL, here presenting with fall.  Patient has frequent falls and is currently from [facility].  Per the nurse, he was noted to have abrasion on the left scalp as well as bleeding from the left elbow area.  It was an unwitnessed fall.  He was seen here several weeks ago after a fall as well. Patient unable to give history due to dementia       The history is provided by the EMS personnel.  Level V Caveat- dementia   Past Medical History:  Diagnosis Date  . Alzheimer's dementia ([place])   . Anemia   . Hyperlipidemia   . Osteomyelitis of ankle or foot, left, acute ([place])   . Thyroid disease   . Vitamin D deficiency     Patient Active Problem List   Diagnosis Date Noted  . Symptomatic sinus bradycardia 08/26/2016  . Loss of consciousness ([place]) 08/25/2016  . Alzheimer's dementia with behavioral disturbance ([place]) 03/06/2016  . Hypothyroid 04/07/2013  . Hyperlipidemia 04/07/2013  . Memory loss, short term 04/07/2013    History reviewed. No pertinent surgical history.      Home Medications    Prior to Admission medications   Medication Sig Start Date End Date Taking? Authorizing Provider  diphenhydrAMINE (BENADRYL) 25 mg capsule Take 25 mg by mouth at bedtime.    [provider]  divalproex (DEPAKOTE) 250 MG DR tablet Take 250 mg by mouth 2 (two) times daily. Takes at 0700, 1400    [provider]  divalproex (DEPAKOTE) 500 MG DR tablet Take 500 mg by mouth at bedtime.    [provider]  QUEtiapine (SEROQUEL) 100 MG tablet Take 100 mg by mouth 2 (two) times daily. 100 MG EVERY MORNING AND 200 MG EVERY EVENING    [provider]  sertraline (ZOLOFT) 25 MG tablet Take 12.5 mg by mouth daily.      [provider]  triamcinolone cream (KENALOG) 0.1 % Apply 1 application topically as needed. 2 TIMES PER DAY ON HANDS        Family History Family History  Problem Relation Age of Onset  . Alzheimer's disease Mother   . Alzheimer's disease Maternal Grandmother     Social History Social History   Tobacco Use  . Smoking status: Former Smoker    Years: 10.00    Types: Cigarettes    Quit date: 06/16/1978    Years since quitting: 40.7  . Smokeless tobacco: Never Used  Substance Use Topics  . Alcohol use: Yes    Comment: SPECIAL OCCASSION  . Drug use: Not on file     Allergies   Patient has no known allergies.   Review of Systems Review of Systems  Skin: Positive for wound.  All other systems reviewed and are negative.    Physical Exam Updated Vital Signs There were no vitals taken for this visit.  Physical Exam Vitals signs and nursing note reviewed.  Constitutional:      Comments: Demented   HENT:     Head: Normocephalic.     Comments: 1 cm laceration L scalp, old R scalp hematoma     Mouth/Throat:     Mouth: Mucous membranes are moist.  Eyes:  Extraocular Movements: Extraocular movements intact.     Pupils: Pupils are equal, round, and reactive to light.  Neck:     Musculoskeletal: Normal range of motion.  Cardiovascular:     Rate and Rhythm: Normal rate and regular rhythm.     Pulses: Normal pulses.     Heart sounds: Normal heart sounds.  Pulmonary:     Effort: Pulmonary effort is normal.     Breath sounds: Normal breath sounds.  Abdominal:     General: Abdomen is flat.     Palpations: Abdomen is soft.  Musculoskeletal:     Comments: Skin tear L elbow, slightly dec ROM. No midline spinal tenderness, no other extremity trauma   Skin:    General: Skin is warm.     Capillary Refill: Capillary refill takes less than 2 seconds.  Neurological:     General: No focal deficit present.     Mental Status: He is alert.  Psychiatric:        Mood  and Affect: Mood normal.        Behavior: Behavior normal.      ED Treatments / Results  Labs (all labs ordered are listed, but only abnormal results are displayed) Labs Reviewed - No data to display  EKG None  Radiology No results found.  Procedures Procedures (including critical care time)  LACERATION REPAIR Performed by: Bruce Fleming Authorized by: Bruce Fleming Consent: Verbal consent obtained. Risks and benefits: risks, benefits and alternatives were discussed Consent given by: patient Patient identity confirmed: provided demographic data Prepped and Draped in normal sterile fashion Wound explored  Laceration Location: L frontal   Laceration Length: 1 cm  No Foreign Bodies seen or palpated  Anesthesia: none   Irrigation method: syringe Amount of cleaning: standard  Skin closure: dermabond    Patient tolerance: Patient tolerated the procedure well with no immediate complications.   Medications Ordered in ED Medications - No data to display   Initial Impression / Assessment and Plan / ED Course  I have reviewed the triage vital signs and the nursing notes.  Pertinent labs & imaging results that were available during my care of the patient were reviewed by me and considered in my medical decision making (see chart for details).       Bruce Fleming is a 73 y.o. male here with fall. Unwitnessed fall and has hx of dementia. Mental status baseline per facility. Will get CT head/neck, L elbow xray.   8:03 PM CT head/neck and xrays unremarkable. Small laceration was dermabonded. Stable for dc back to facility   Final Clinical Impressions(s) / ED Diagnoses   Final diagnoses:  None    ED Discharge Orders    None       Charlynne Pander, MD 03/24/19 2004

## 2019-06-17 DEATH — deceased

## 2020-12-20 IMAGING — CT CT CERVICAL SPINE W/O CM
3 of 6 series · 10 of 33 positions shown, 11 images · non-contrast
Comparison: March 18, 2017

CLINICAL DATA: Unwitnessed fall

EXAM:
CT HEAD WITHOUT CONTRAST; CT CERVICAL SPINE WITHOUT CONTRAST
TECHNIQUE: Contiguous axial images were obtained from the base of the skull
through the vertex without intravenous contrast.

[Series 8: orthogonal bone · axial · 0.23mm/px · z∈[-319,-253]mm · 2 of 120 slices shown, 3 images]
[im 40/120  soft-tissue]
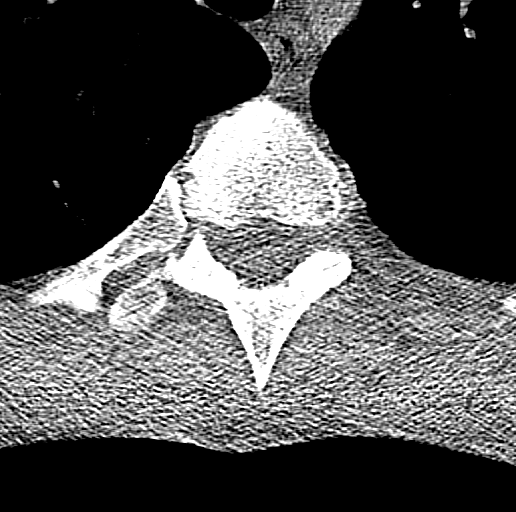
[im 40/120  bone]
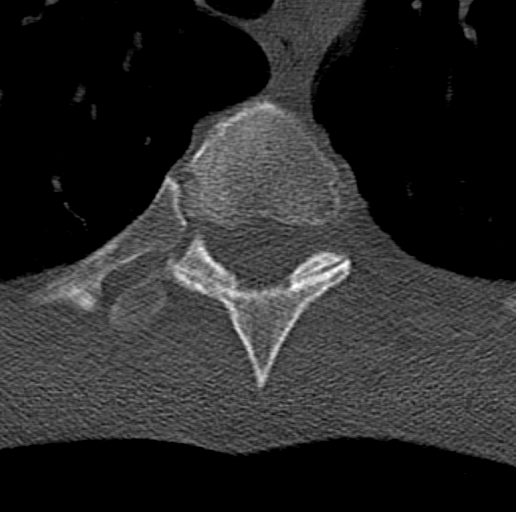
[im 80/120  bone]
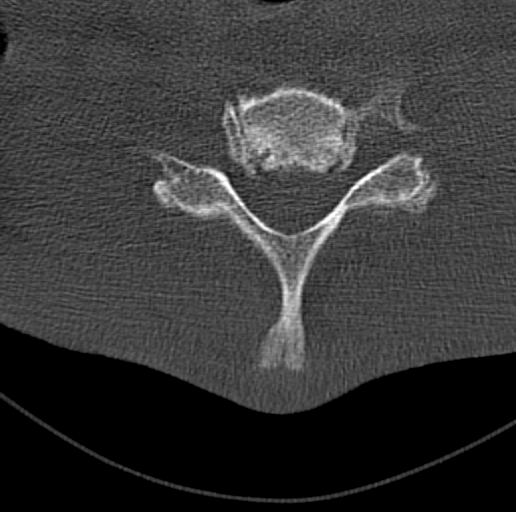

[Series 9: coronal bone · coronal · 0.23mm/px · 3 of 60 slices shown]
[im 17/60  bone]
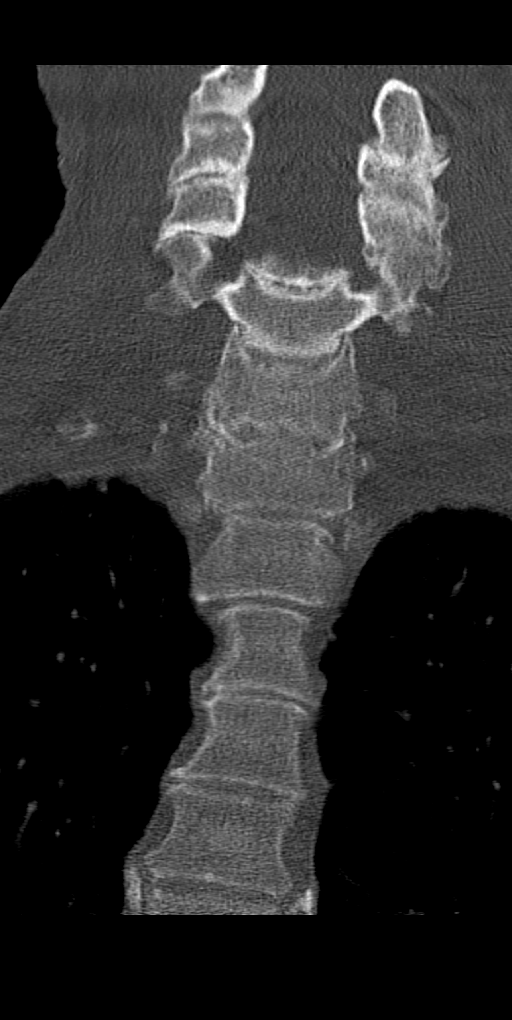
[im 26/60  bone]
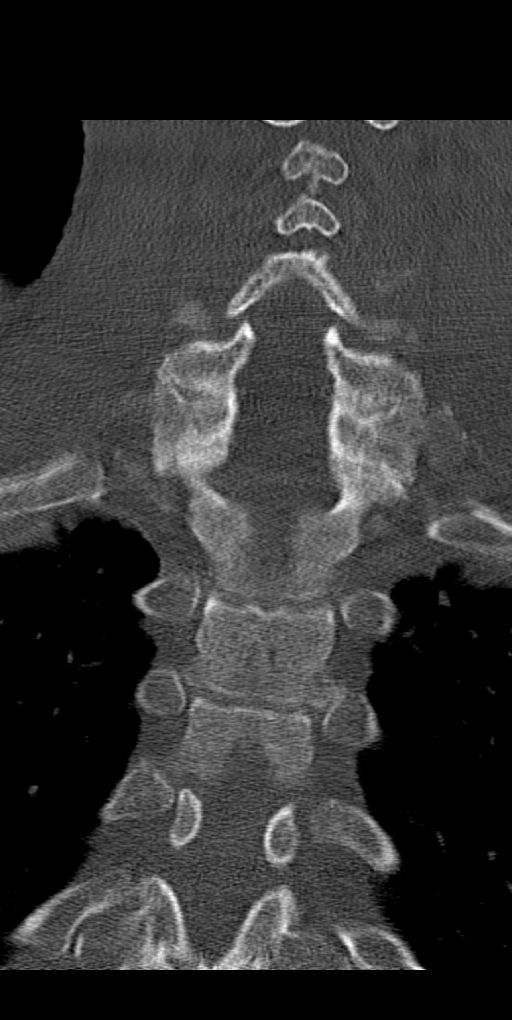
[im 34/60  bone]
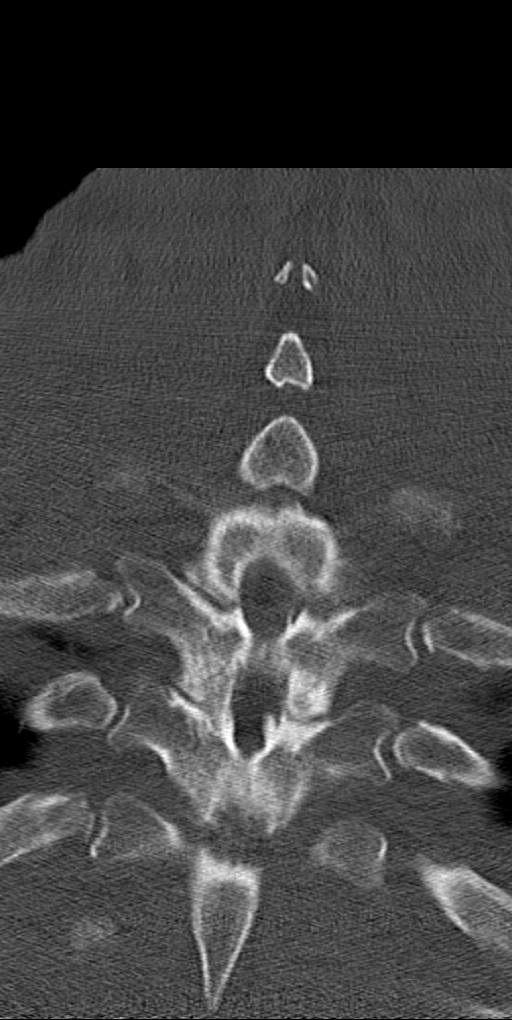

[Series 10: sagittal bone · sagittal · 0.23mm/px · 5 of 61 slices shown]
[im 11/61  bone]
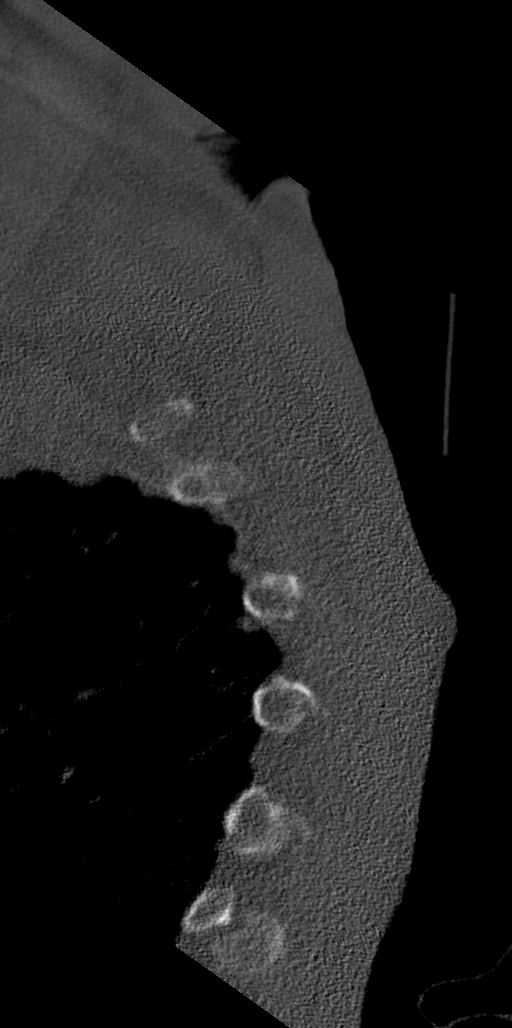
[im 21/61  bone]
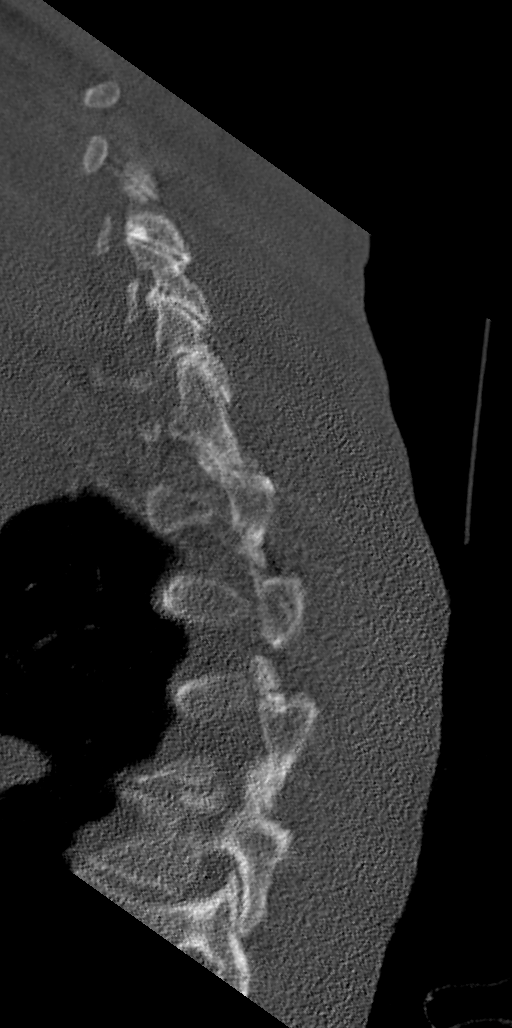
[im 31/61  bone]
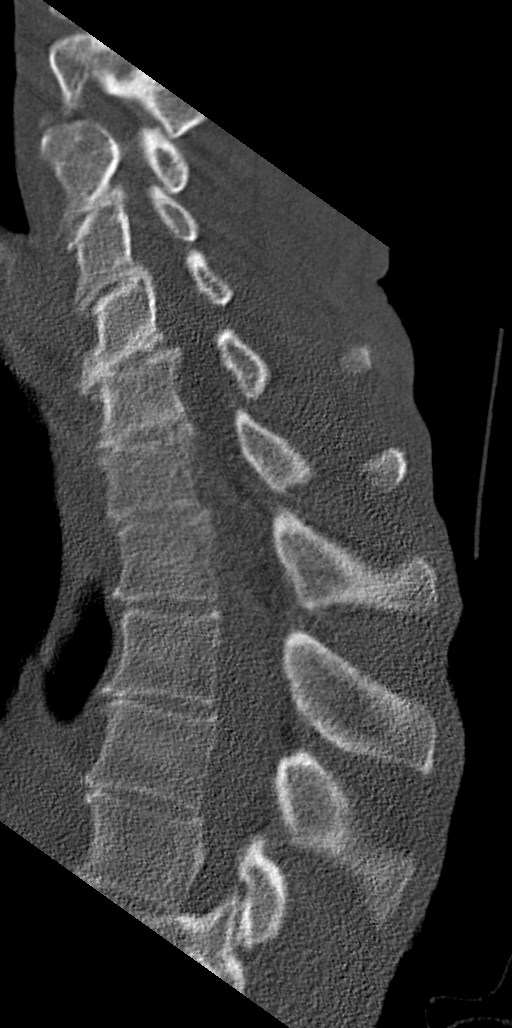
[im 41/61  bone]
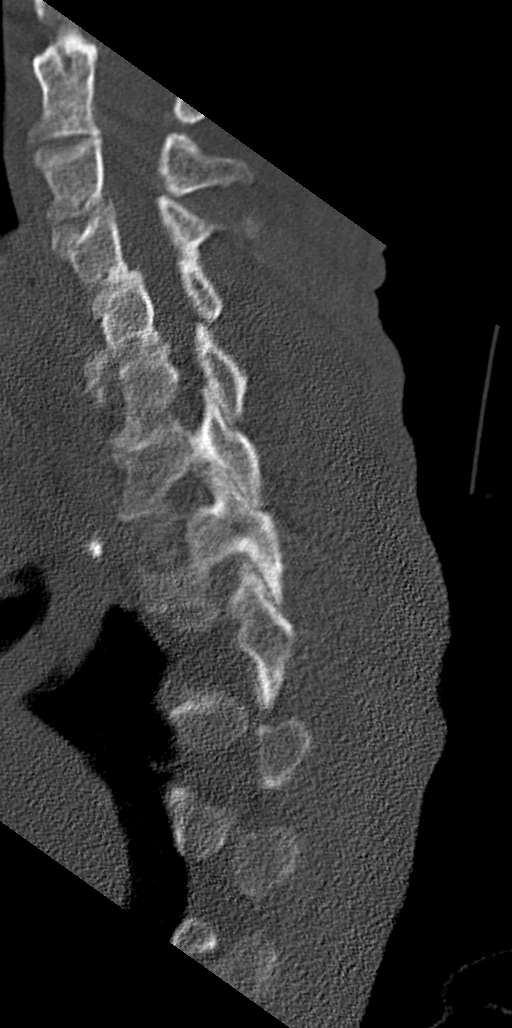
[im 51/61  bone]
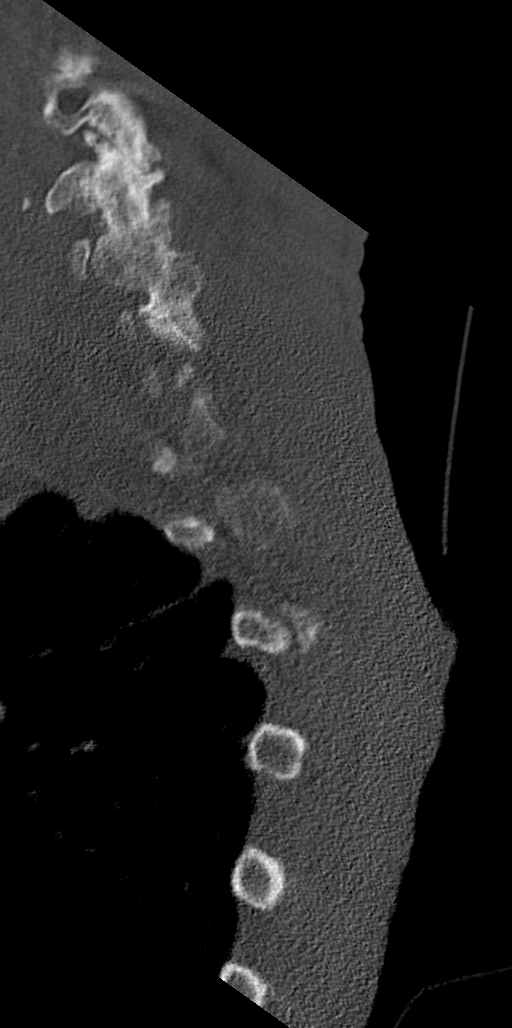

[10 of 33 positions shown; findings below may reference images not displayed]

FINDINGS: Brain: No evidence of acute territorial infarction, hemorrhage,
hydrocephalus,extra-axial collection or mass lesion/mass effect.
There is dilatation the ventricles and sulci consistent with
age-related atrophy. Low-attenuation changes in the deep white
matter consistent with small vessel ischemia.

Vascular: No hyperdense vessel or unexpected calcification.

Skull: The skull is intact. No fracture or focal lesion identified.

Sinuses/Orbits: The visualized paranasal sinuses and mastoid air
cells are clear. The orbits and globes intact.

Other: Soft tissue swelling seen over the right forehead

Cervical spine:

Alignment: There is slight reversal of the normal cervical lordosis.

Skull base and vertebrae: Visualized skull base is intact. No
atlanto-occipital dissociation. The vertebral body heights are well
maintained. No fracture or pathologic osseous lesion seen.

Soft tissues and spinal canal: The visualized paraspinal soft
tissues are unremarkable. No prevertebral soft tissue swelling is
seen. The spinal canal is grossly unremarkable, no large epidural
collection or significant canal narrowing.

Disc levels: There is disc osteophyte and uncovertebral osteophyte
formation most notable at C5 through C7 with neural foraminal and
mild canal stenosis.

Upper chest: The lung apices are clear. Thoracic inlet is within
normal limits.

Other: None
IMPRESSION: No acute intracranial abnormality.

Findings consistent with age related atrophy and chronic small
vessel ischemia

Soft tissue swelling over the right forehead.

No acute fracture or malalignment of the spine.

Cervical spine spondylosis most notable from C5 through C7
